# Patient Record
Sex: Female | Born: 1980 | Race: Asian | Hispanic: No | Marital: Single | Smoking: Never smoker
Health system: Southern US, Community
[De-identification: ages and names within clinical notes are randomized; demographics above are authoritative.]

## PROBLEM LIST (undated history)

## (undated) DIAGNOSIS — Z789 Other specified health status: Secondary | ICD-10-CM

## (undated) HISTORY — PX: NO PAST SURGERIES: SHX2092

---
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
## 2003-06-27 ENCOUNTER — Encounter: Admission: RE | Admit: 2003-06-27 | Discharge: 2003-06-27 | Payer: Self-pay | Admitting: *Deleted

## 2004-03-26 ENCOUNTER — Other Ambulatory Visit: Admission: RE | Admit: 2004-03-26 | Discharge: 2004-03-26 | Payer: Self-pay | Admitting: Obstetrics and Gynecology

## 2004-03-27 ENCOUNTER — Other Ambulatory Visit: Admission: RE | Admit: 2004-03-27 | Discharge: 2004-03-27 | Payer: Self-pay | Admitting: Obstetrics and Gynecology

## 2004-04-03 ENCOUNTER — Ambulatory Visit (HOSPITAL_COMMUNITY): Admission: RE | Admit: 2004-04-03 | Discharge: 2004-04-03 | Payer: Self-pay | Admitting: Obstetrics and Gynecology

## 2004-10-08 ENCOUNTER — Inpatient Hospital Stay (HOSPITAL_COMMUNITY): Admission: AD | Admit: 2004-10-08 | Discharge: 2004-10-10 | Payer: Self-pay | Admitting: Obstetrics and Gynecology

## 2005-04-02 ENCOUNTER — Other Ambulatory Visit: Admission: RE | Admit: 2005-04-02 | Discharge: 2005-04-02 | Payer: Self-pay | Admitting: Obstetrics and Gynecology

## 2005-11-05 ENCOUNTER — Ambulatory Visit: Payer: Self-pay | Admitting: Nurse Practitioner

## 2005-11-11 ENCOUNTER — Ambulatory Visit: Payer: Self-pay | Admitting: Nurse Practitioner

## 2005-11-11 ENCOUNTER — Ambulatory Visit (HOSPITAL_COMMUNITY): Admission: RE | Admit: 2005-11-11 | Discharge: 2005-11-11 | Payer: Self-pay | Admitting: Nurse Practitioner

## 2005-11-13 ENCOUNTER — Ambulatory Visit (HOSPITAL_COMMUNITY): Admission: RE | Admit: 2005-11-13 | Discharge: 2005-11-13 | Payer: Self-pay | Admitting: Internal Medicine

## 2005-11-19 ENCOUNTER — Ambulatory Visit: Payer: Self-pay | Admitting: Nurse Practitioner

## 2005-11-27 ENCOUNTER — Ambulatory Visit: Payer: Self-pay | Admitting: *Deleted

## 2005-11-30 ENCOUNTER — Ambulatory Visit (HOSPITAL_COMMUNITY): Admission: RE | Admit: 2005-11-30 | Discharge: 2005-11-30 | Payer: Self-pay | Admitting: Nurse Practitioner

## 2005-12-01 ENCOUNTER — Ambulatory Visit: Payer: Self-pay | Admitting: Nurse Practitioner

## 2006-08-18 ENCOUNTER — Emergency Department (HOSPITAL_COMMUNITY): Admission: EM | Admit: 2006-08-18 | Discharge: 2006-08-18 | Payer: Self-pay | Admitting: Emergency Medicine

## 2006-09-15 ENCOUNTER — Ambulatory Visit: Payer: Self-pay | Admitting: Nurse Practitioner

## 2006-09-21 ENCOUNTER — Ambulatory Visit: Payer: Self-pay | Admitting: Nurse Practitioner

## 2006-09-28 ENCOUNTER — Ambulatory Visit (HOSPITAL_COMMUNITY): Admission: RE | Admit: 2006-09-28 | Discharge: 2006-09-28 | Payer: Self-pay | Admitting: Family Medicine

## 2006-09-30 ENCOUNTER — Ambulatory Visit: Payer: Self-pay | Admitting: Nurse Practitioner

## 2007-02-06 ENCOUNTER — Ambulatory Visit (HOSPITAL_COMMUNITY): Admission: RE | Admit: 2007-02-06 | Discharge: 2007-02-06 | Payer: Self-pay | Admitting: Endocrinology

## 2007-02-15 ENCOUNTER — Other Ambulatory Visit: Admission: RE | Admit: 2007-02-15 | Discharge: 2007-02-15 | Payer: Self-pay | Admitting: Internal Medicine

## 2009-12-15 ENCOUNTER — Ambulatory Visit: Payer: Self-pay | Admitting: Advanced Practice Midwife

## 2009-12-15 ENCOUNTER — Inpatient Hospital Stay (HOSPITAL_COMMUNITY): Admission: AD | Admit: 2009-12-15 | Discharge: 2009-12-15 | Payer: Self-pay | Admitting: Obstetrics and Gynecology

## 2009-12-17 ENCOUNTER — Inpatient Hospital Stay (HOSPITAL_COMMUNITY): Admission: AD | Admit: 2009-12-17 | Discharge: 2009-12-17 | Payer: Self-pay | Admitting: Obstetrics & Gynecology

## 2010-12-29 NOTE — L&D Delivery Note (Signed)
Pt rapidly completed the first stage.  Second stage was brief.  She had a SVD of one live viable female over second degree midline tear.  Placenta S/I 3 vessel cord.  Tear closed with 3-0 vicryl.  EBL-400cc.  Baby to NBN.

## 2011-01-09 LAB — HIV ANTIBODY (ROUTINE TESTING W REFLEX): HIV: NONREACTIVE

## 2011-01-09 LAB — ABO/RH: RH Type: POSITIVE

## 2011-01-18 ENCOUNTER — Encounter: Payer: Self-pay | Admitting: Family Medicine

## 2011-01-19 ENCOUNTER — Encounter: Payer: Self-pay | Admitting: Endocrinology

## 2011-03-31 LAB — WET PREP, GENITAL
Clue Cells Wet Prep HPF POC: NONE SEEN
Trich, Wet Prep: NONE SEEN
Yeast Wet Prep HPF POC: NONE SEEN

## 2011-03-31 LAB — URINALYSIS, ROUTINE W REFLEX MICROSCOPIC
Bilirubin Urine: NEGATIVE
Glucose, UA: NEGATIVE mg/dL
Ketones, ur: NEGATIVE mg/dL
Nitrite: NEGATIVE
Protein, ur: NEGATIVE mg/dL
Specific Gravity, Urine: <1.005 — ABNORMAL LOW (ref 1.005–1.030)
Urobilinogen, UA: 0.2 mg/dL (ref 0.0–1.0)
pH: 5.5 (ref 5.0–8.0)

## 2011-03-31 LAB — GC/CHLAMYDIA PROBE AMP, GENITAL
Chlamydia, DNA Probe: NEGATIVE
GC Probe Amp, Genital: NEGATIVE

## 2011-03-31 LAB — ABO/RH: ABO/RH(D): A POS

## 2011-03-31 LAB — CBC
HCT: 36.9 % (ref 36.0–46.0)
Hemoglobin: 12 g/dL (ref 12.0–15.0)
MCHC: 32.6 g/dL (ref 30.0–36.0)
MCV: 81.5 fL (ref 78.0–100.0)
Platelets: 310 10*3/uL (ref 150–400)
RBC: 4.53 MIL/uL (ref 3.87–5.11)
RDW: 14.7 % (ref 11.5–15.5)
WBC: 8.1 10*3/uL (ref 4.0–10.5)

## 2011-03-31 LAB — HCG, QUANTITATIVE, PREGNANCY
hCG, Beta Chain, Quant, S: 1325 m[IU]/mL — ABNORMAL HIGH (ref <5)
hCG, Beta Chain, Quant, S: 254 m[IU]/mL — ABNORMAL HIGH (ref <5)

## 2011-03-31 LAB — URINE MICROSCOPIC-ADD ON

## 2011-08-05 ENCOUNTER — Encounter (HOSPITAL_COMMUNITY): Payer: Self-pay | Admitting: Anesthesiology

## 2011-08-05 ENCOUNTER — Encounter (HOSPITAL_COMMUNITY): Payer: Self-pay | Admitting: *Deleted

## 2011-08-05 ENCOUNTER — Inpatient Hospital Stay (HOSPITAL_COMMUNITY): Payer: Medicaid Other | Admitting: Anesthesiology

## 2011-08-05 ENCOUNTER — Inpatient Hospital Stay (HOSPITAL_COMMUNITY)
Admission: AD | Admit: 2011-08-05 | Discharge: 2011-08-07 | DRG: 775 | Disposition: A | Discharge status: Home / Self Care | Payer: Medicaid Other | Source: Ambulatory Visit | Attending: Obstetrics and Gynecology | Admitting: Obstetrics and Gynecology

## 2011-08-05 DIAGNOSIS — Z3480 Encounter for supervision of other normal pregnancy, unspecified trimester: Secondary | ICD-10-CM

## 2011-08-05 HISTORY — DX: Other specified health status: Z78.9

## 2011-08-05 LAB — CBC
HCT: 41.2 % (ref 36.0–46.0)
Hemoglobin: 13.4 g/dL (ref 12.0–15.0)
MCHC: 32.5 g/dL (ref 30.0–36.0)

## 2011-08-05 MED ORDER — EPHEDRINE 5 MG/ML INJ
10.0000 mg | INTRAVENOUS | Status: DC | PRN
  Filled 2011-08-05: qty 4

## 2011-08-05 MED ORDER — LIDOCAINE HCL (PF) 1 % IJ SOLN
30.0000 mL | INTRAMUSCULAR | Status: DC | PRN
  Filled 2011-08-05 (×2): qty 30

## 2011-08-05 MED ORDER — ONDANSETRON HCL 4 MG/2ML IJ SOLN: 4.0000 mg | INTRAMUSCULAR | Status: DC | PRN

## 2011-08-05 MED ORDER — IBUPROFEN 600 MG PO TABS
600.0000 mg | ORAL_TABLET | Freq: Four times a day (QID) | ORAL | Status: DC
  Administered 2011-08-06 – 2011-08-07 (×7): 600 mg via ORAL
  Filled 2011-08-05 (×8): qty 1

## 2011-08-05 MED ORDER — CITRIC ACID-SODIUM CITRATE 334-500 MG/5ML PO SOLN: 30.0000 mL | ORAL | Status: DC | PRN

## 2011-08-05 MED ORDER — SIMETHICONE 80 MG PO CHEW: 80.0000 mg | CHEWABLE_TABLET | ORAL | Status: DC | PRN

## 2011-08-05 MED ORDER — LACTATED RINGERS IV SOLN
INTRAVENOUS | Status: DC
  Administered 2011-08-05 (×2): via INTRAVENOUS

## 2011-08-05 MED ORDER — LIDOCAINE HCL 1.5 % IJ SOLN
INTRAMUSCULAR | Status: DC | PRN
  Administered 2011-08-05 (×2): 4 mL via EPIDURAL

## 2011-08-05 MED ORDER — OXYTOCIN 10 UNIT/ML IJ SOLN
INTRAMUSCULAR | Status: AC
  Filled 2011-08-05: qty 2

## 2011-08-05 MED ORDER — OXYCODONE-ACETAMINOPHEN 5-325 MG PO TABS: 2.0000 | ORAL_TABLET | ORAL | Status: DC | PRN

## 2011-08-05 MED ORDER — OXYTOCIN 20 UNITS IN LACTATED RINGERS INFUSION - SIMPLE
125.0000 mL/h | Freq: Once | INTRAVENOUS | Status: AC
  Administered 2011-08-05: 999 mL/h via INTRAVENOUS
  Filled 2011-08-05: qty 1000

## 2011-08-05 MED ORDER — IBUPROFEN 600 MG PO TABS: 600.0000 mg | ORAL_TABLET | Freq: Four times a day (QID) | ORAL | Status: DC | PRN

## 2011-08-05 MED ORDER — LACTATED RINGERS IV SOLN: 500.0000 mL | INTRAVENOUS | Status: DC | PRN

## 2011-08-05 MED ORDER — EPHEDRINE 5 MG/ML INJ
10.0000 mg | INTRAVENOUS | Status: DC | PRN
  Filled 2011-08-05 (×2): qty 4

## 2011-08-05 MED ORDER — ONDANSETRON HCL 4 MG/2ML IJ SOLN: 4.0000 mg | Freq: Four times a day (QID) | INTRAMUSCULAR | Status: DC | PRN

## 2011-08-05 MED ORDER — NALBUPHINE HCL 10 MG/ML IJ SOLN: 10.0000 mg | Freq: Once | INTRAMUSCULAR | Status: DC

## 2011-08-05 MED ORDER — PHENYLEPHRINE 40 MCG/ML (10ML) SYRINGE FOR IV PUSH (FOR BLOOD PRESSURE SUPPORT)
80.0000 ug | PREFILLED_SYRINGE | INTRAVENOUS | Status: DC | PRN
  Filled 2011-08-05 (×2): qty 5

## 2011-08-05 MED ORDER — ONDANSETRON HCL 4 MG PO TABS: 4.0000 mg | ORAL_TABLET | ORAL | Status: DC | PRN

## 2011-08-05 MED ORDER — FLEET ENEMA 7-19 GM/118ML RE ENEM: 1.0000 | ENEMA | RECTAL | Status: DC | PRN

## 2011-08-05 MED ORDER — FENTANYL 2.5 MCG/ML BUPIVACAINE 1/10 % EPIDURAL INFUSION (WH - ANES)
14.0000 mL/h | INTRAMUSCULAR | Status: DC
  Administered 2011-08-05 (×2): 14 mL/h via EPIDURAL
  Filled 2011-08-05 (×2): qty 60

## 2011-08-05 MED ORDER — DIBUCAINE 1 % RE OINT
1.0000 "application " | TOPICAL_OINTMENT | RECTAL | Status: DC | PRN
  Filled 2011-08-05: qty 28

## 2011-08-05 MED ORDER — DIPHENHYDRAMINE HCL 50 MG/ML IJ SOLN: 12.5000 mg | INTRAMUSCULAR | Status: DC | PRN

## 2011-08-05 MED ORDER — MEASLES, MUMPS & RUBELLA VAC ~~LOC~~ INJ: 0.5000 mL | INJECTION | Freq: Once | SUBCUTANEOUS | Status: DC

## 2011-08-05 MED ORDER — PENICILLIN G POTASSIUM 5000000 UNITS IJ SOLR
2.5000 10*6.[IU] | INTRAVENOUS | Status: DC
  Administered 2011-08-05: 2.5 10*6.[IU] via INTRAVENOUS
  Filled 2011-08-05 (×3): qty 2.5

## 2011-08-05 MED ORDER — WITCH HAZEL-GLYCERIN EX PADS: 1.0000 "application " | MEDICATED_PAD | CUTANEOUS | Status: DC | PRN

## 2011-08-05 MED ORDER — OXYCODONE-ACETAMINOPHEN 5-325 MG PO TABS
1.0000 | ORAL_TABLET | ORAL | Status: DC | PRN
  Administered 2011-08-05: 1 via ORAL
  Filled 2011-08-05 (×2): qty 1

## 2011-08-05 MED ORDER — BENZOCAINE-MENTHOL 20-0.5 % EX AERO: 1.0000 "application " | INHALATION_SPRAY | CUTANEOUS | Status: DC | PRN

## 2011-08-05 MED ORDER — ZOLPIDEM TARTRATE 5 MG PO TABS: 5.0000 mg | ORAL_TABLET | Freq: Every evening | ORAL | Status: DC | PRN

## 2011-08-05 MED ORDER — TETANUS-DIPHTH-ACELL PERTUSSIS 5-2.5-18.5 LF-MCG/0.5 IM SUSP
0.5000 mL | Freq: Once | INTRAMUSCULAR | Status: AC
  Administered 2011-08-06: 0.5 mL via INTRAMUSCULAR
  Filled 2011-08-05: qty 0.5

## 2011-08-05 MED ORDER — LACTATED RINGERS IV SOLN: 500.0000 mL | Freq: Once | INTRAVENOUS | Status: DC

## 2011-08-05 MED ORDER — PENICILLIN G POTASSIUM 5000000 UNITS IJ SOLR
5.0000 10*6.[IU] | Freq: Once | INTRAVENOUS | Status: AC
  Administered 2011-08-05: 5 10*6.[IU] via INTRAVENOUS
  Filled 2011-08-05: qty 5

## 2011-08-05 MED ORDER — ACETAMINOPHEN 325 MG PO TABS: 650.0000 mg | ORAL_TABLET | ORAL | Status: DC | PRN

## 2011-08-05 MED ORDER — PHENYLEPHRINE 40 MCG/ML (10ML) SYRINGE FOR IV PUSH (FOR BLOOD PRESSURE SUPPORT)
80.0000 ug | PREFILLED_SYRINGE | INTRAVENOUS | Status: DC | PRN
  Filled 2011-08-05: qty 5

## 2011-08-05 NOTE — Progress Notes (Signed)
Pt G4 P2 at 39.1wks, having contractions.  Pt denies problems with pregnancy.  GBS +.

## 2011-08-05 NOTE — Anesthesia Procedure Notes (Signed)
Epidural Patient location during procedure: OB Start time: 08/05/2011 5:41 AM  Staffing Anesthesiologist: Julyssa Kyer A. Performed by: anesthesiologist   Preanesthetic Checklist Completed: patient identified, site marked, surgical consent, pre-op evaluation, timeout performed, IV checked, risks and benefits discussed and monitors and equipment checked  Epidural Patient position: sitting Prep: site prepped and draped and DuraPrep Patient monitoring: continuous pulse ox and blood pressure Approach: midline Injection technique: LOR air  Needle:  Needle type: Tuohy  Needle gauge: 17 G Needle length: 9 cm Needle insertion depth: 5 cm cm Catheter type: closed end flexible Catheter size: 19 Gauge Catheter at skin depth: 8 cm Test dose: negative  Assessment Events: blood not aspirated, injection not painful, no injection resistance, negative IV test and no paresthesia  Additional Notes Patient is more comfortable after epidural dosed. Please see RN's note for documentation of vital signs and FHR which are stable.

## 2011-08-05 NOTE — Progress Notes (Signed)
UR Chart review completed.  

## 2011-08-05 NOTE — Anesthesia Preprocedure Evaluation (Signed)
Anesthesia Evaluation  Name, MR# and DOB Patient awake  General Assessment Comment  Reviewed: Allergy & Precautions, H&P , Patient's Chart, lab work & pertinent test results  Airway Mallampati: II TM Distance: >3 FB Neck ROM: full    Dental No notable dental hx. (+) Teeth Intact   Pulmonary  clear to auscultation  pulmonary exam normalPulmonary Exam Normal breath sounds clear to auscultation none    Cardiovascular regular Normal    Neuro/Psych Negative Neurological ROS  Negative Psych ROS  GI/Hepatic/Renal negative GI ROS, negative Liver ROS, and negative Renal ROS (+)       Endo/Other  Negative Endocrine ROS (+)      Abdominal   Musculoskeletal   Hematology negative hematology ROS (+)   Peds  Reproductive/Obstetrics (+) Pregnancy    Anesthesia Other Findings             Anesthesia Physical Anesthesia Plan  ASA: II  Anesthesia Plan: Epidural   Post-op Pain Management:    Induction:   Airway Management Planned:   Additional Equipment:   Intra-op Plan:   Post-operative Plan:   Informed Consent: I have reviewed the patients History and Physical, chart, labs and discussed the procedure including the risks, benefits and alternatives for the proposed anesthesia with the patient or authorized representative who has indicated his/her understanding and acceptance.     Plan Discussed with: Anesthesiologist  Anesthesia Plan Comments:         Anesthesia Quick Evaluation

## 2011-08-06 LAB — CBC
HCT: 36 % (ref 36.0–46.0)
MCHC: 32.8 g/dL (ref 30.0–36.0)
Platelets: 177 10*3/uL (ref 150–400)
RDW: 14.8 % (ref 11.5–15.5)
WBC: 10.4 10*3/uL (ref 4.0–10.5)

## 2011-08-06 MED ORDER — SENNOSIDES-DOCUSATE SODIUM 8.6-50 MG PO TABS
1.0000 | ORAL_TABLET | Freq: Every day | ORAL | Status: DC
  Administered 2011-08-06: 1 via ORAL

## 2011-08-06 NOTE — Progress Notes (Signed)
  Post Partum Day 1 Subjective: no complaints  Objective: Blood pressure 96/61, pulse 67, temperature 98.2 F (36.8 C), temperature source Oral, resp. rate 18, height 5\' 3"  (1.6 m), weight 65.318 kg (144 lb), SpO2 98.00%, not breastfeeding.  Physical Exam:  General: alert Lochia: appropriate Uterine Fundus: firm   Basename 08/06/11 0530 08/05/11 0500  HGB 11.8* 13.4  HCT 36.0 41.2    Assessment/Plan: Plan for discharge tomorrow   LOS: 1 day   Jaxson Anglin D 08/06/2011, 9:44 AM

## 2011-08-06 NOTE — Anesthesia Postprocedure Evaluation (Signed)
  Anesthesia Post-op Note  Patient: Jasmine Henry  Procedure(s) Performed: * Lumbar Epidural for L & D *  Patient Location: Labor and Delivery  Anesthesia Type: Epidural  Level of Consciousness: awake, alert  and oriented  Airway and Oxygen Therapy: Patient Spontanous Breathing  Post-op Pain: none  Post-op Assessment: Post-op Vital signs reviewed, Patient's Cardiovascular Status Stable, Respiratory Function Stable, Patent Airway, No signs of Nausea or vomiting, Pain level controlled, No headache, No backache, No residual numbness and No residual motor weakness  Post-op Vital Signs: Reviewed and stable  Complications: No apparent anesthesia complications

## 2011-08-06 NOTE — Anesthesia Postprocedure Evaluation (Signed)
  Anesthesia Post-op Note  Patient: Jasmine Henry Anesthesia Post Note  Patient: Jasmine Henry  Procedure(s) Performed: * No procedures listed *  Anesthesia type: Epidural  Patient location: Mother/Baby  Post pain: Pain level controlled  Post assessment: Post-op Vital signs reviewed  Last Vitals:  Filed Vitals:   08/05/11 2130  BP: 109/67  Pulse: 85  Temp: 98.7 F (37.1 C)  Resp: 18    Post vital signs: Reviewed  Level of consciousness: awake  Complications: No apparent anesthesia complications

## 2011-08-07 MED ORDER — IBUPROFEN 600 MG PO TABS: 600.0000 mg | ORAL_TABLET | Freq: Four times a day (QID) | ORAL | Status: AC

## 2011-08-07 NOTE — Progress Notes (Signed)
  PPD#2  Pt without complaints.  Wants to go home. Lochia-wnl VSSAF IMP/stable  PLAN/ D/C

## 2011-08-07 NOTE — Discharge Summary (Signed)
Obstetric Discharge Summary Reason for Admission: onset of labor Prenatal Procedures: ultrasound Intrapartum Procedures: spontaneous vaginal delivery Postpartum Procedures: none Complications-Operative and Postpartum: 2 degree perineal laceration Hemoglobin  Date Value Range Status  08/06/2011 11.8* 12.0-15.0 (g/dL) Final     HCT  Date Value Range Status  08/06/2011 36.0  36.0-46.0 (%) Final    Discharge Diagnoses: Term Pregnancy-delivered  Discharge Information: Date: 08/07/2011 Activity: unrestricted Diet: routine Medications: PNV and Ibuprophen Condition: stable Instructions: refer to practice specific booklet Discharge to: home Follow-up Information    Follow up with ANDERSON,MARK E. Make an appointment in 4 weeks.   Contact information:   [address] [place] [address] [phone]          Newborn Data: Live born female  Birth Weight: 6 lb 15 oz (3147 g) APGAR: 8, 9  Home with mother.  ANDERSON,MARK E 08/07/2011, 8:20 AM

## 2011-08-07 NOTE — H&P (Signed)
Pt is a 30 year old female who was admitted by Dr. Tenny Craw in labor.  PNC was uncomplicated. PMHx: see [facility]. PE: HEENT- wnl  Abd-gravid, palp ctxs  FHTs - reactive IMP/IUP at term PLAN/ Admit

## 2011-12-30 NOTE — L&D Delivery Note (Signed)
Patient was C/C/+1 and pushed for <15 minutes with epidural.   NSVD  female infant, Apgars 9/9, weight pending.   The patient had one small second degree perineal laceration repaired with 3-0 vicryl. Fundus was firm. EBL was expected. Placenta was delivered intact. Vagina was clear.  Baby was vigorous to bedside.  Philip Aspen

## 2012-05-17 LAB — OB RESULTS CONSOLE GC/CHLAMYDIA
Chlamydia: NEGATIVE
Gonorrhea: NEGATIVE

## 2012-07-26 ENCOUNTER — Inpatient Hospital Stay (HOSPITAL_COMMUNITY): Payer: Medicaid Other | Admitting: Anesthesiology

## 2012-07-26 ENCOUNTER — Inpatient Hospital Stay (HOSPITAL_COMMUNITY)
Admission: AD | Admit: 2012-07-26 | Discharge: 2012-07-28 | DRG: 775 | Disposition: A | Discharge status: Home / Self Care | Payer: Medicaid Other | Source: Ambulatory Visit | Attending: Obstetrics and Gynecology | Admitting: Obstetrics and Gynecology

## 2012-07-26 ENCOUNTER — Encounter (HOSPITAL_COMMUNITY): Payer: Self-pay | Admitting: *Deleted

## 2012-07-26 ENCOUNTER — Encounter (HOSPITAL_COMMUNITY): Payer: Self-pay | Admitting: Anesthesiology

## 2012-07-26 DIAGNOSIS — O99892 Other specified diseases and conditions complicating childbirth: Secondary | ICD-10-CM | POA: Diagnosis present

## 2012-07-26 DIAGNOSIS — Z22330 Carrier of Group B streptococcus: Secondary | ICD-10-CM

## 2012-07-26 LAB — CBC
Platelets: 198 10*3/uL (ref 150–400)
RBC: 4.58 MIL/uL (ref 3.87–5.11)
RDW: 14.7 % (ref 11.5–15.5)
WBC: 8.5 10*3/uL (ref 4.0–10.5)

## 2012-07-26 MED ORDER — PHENYLEPHRINE 40 MCG/ML (10ML) SYRINGE FOR IV PUSH (FOR BLOOD PRESSURE SUPPORT)
80.0000 ug | PREFILLED_SYRINGE | INTRAVENOUS | Status: DC | PRN
  Filled 2012-07-26: qty 5

## 2012-07-26 MED ORDER — LACTATED RINGERS IV SOLN: 500.0000 mL | INTRAVENOUS | Status: DC | PRN

## 2012-07-26 MED ORDER — LACTATED RINGERS IV SOLN
500.0000 mL | Freq: Once | INTRAVENOUS | Status: AC
  Administered 2012-07-26: 500 mL via INTRAVENOUS

## 2012-07-26 MED ORDER — ONDANSETRON HCL 4 MG/2ML IJ SOLN: 4.0000 mg | Freq: Four times a day (QID) | INTRAMUSCULAR | Status: DC | PRN

## 2012-07-26 MED ORDER — OXYTOCIN BOLUS FROM INFUSION
250.0000 mL | Freq: Once | INTRAVENOUS | Status: DC
  Filled 2012-07-26: qty 500

## 2012-07-26 MED ORDER — FENTANYL 2.5 MCG/ML BUPIVACAINE 1/10 % EPIDURAL INFUSION (WH - ANES)
14.0000 mL/h | INTRAMUSCULAR | Status: DC
  Administered 2012-07-27: 14 mL/h via EPIDURAL
  Filled 2012-07-26 (×2): qty 60

## 2012-07-26 MED ORDER — CITRIC ACID-SODIUM CITRATE 334-500 MG/5ML PO SOLN: 30.0000 mL | ORAL | Status: DC | PRN

## 2012-07-26 MED ORDER — ACETAMINOPHEN 325 MG PO TABS: 650.0000 mg | ORAL_TABLET | ORAL | Status: DC | PRN

## 2012-07-26 MED ORDER — FENTANYL 2.5 MCG/ML BUPIVACAINE 1/10 % EPIDURAL INFUSION (WH - ANES)
INTRAMUSCULAR | Status: DC | PRN
  Administered 2012-07-26: 13 mL/h via EPIDURAL

## 2012-07-26 MED ORDER — LIDOCAINE HCL (PF) 1 % IJ SOLN
30.0000 mL | INTRAMUSCULAR | Status: DC | PRN
  Administered 2012-07-27: 30 mL via SUBCUTANEOUS
  Filled 2012-07-26: qty 30

## 2012-07-26 MED ORDER — EPHEDRINE 5 MG/ML INJ: 10.0000 mg | INTRAVENOUS | Status: DC | PRN

## 2012-07-26 MED ORDER — LACTATED RINGERS IV SOLN
INTRAVENOUS | Status: DC
  Administered 2012-07-26 – 2012-07-27 (×2): via INTRAVENOUS

## 2012-07-26 MED ORDER — PHENYLEPHRINE 40 MCG/ML (10ML) SYRINGE FOR IV PUSH (FOR BLOOD PRESSURE SUPPORT): 80.0000 ug | PREFILLED_SYRINGE | INTRAVENOUS | Status: DC | PRN

## 2012-07-26 MED ORDER — FLEET ENEMA 7-19 GM/118ML RE ENEM: 1.0000 | ENEMA | RECTAL | Status: DC | PRN

## 2012-07-26 MED ORDER — PENICILLIN G POTASSIUM 5000000 UNITS IJ SOLR
5.0000 10*6.[IU] | Freq: Once | INTRAVENOUS | Status: AC
  Administered 2012-07-26: 5 10*6.[IU] via INTRAVENOUS
  Filled 2012-07-26: qty 5

## 2012-07-26 MED ORDER — OXYCODONE-ACETAMINOPHEN 5-325 MG PO TABS: 1.0000 | ORAL_TABLET | ORAL | Status: DC | PRN

## 2012-07-26 MED ORDER — SODIUM BICARBONATE 8.4 % IV SOLN
INTRAVENOUS | Status: DC | PRN
  Administered 2012-07-26: 4 mL via EPIDURAL

## 2012-07-26 MED ORDER — PENICILLIN G POTASSIUM 5000000 UNITS IJ SOLR
2.5000 10*6.[IU] | INTRAVENOUS | Status: DC
  Administered 2012-07-27: 2.5 10*6.[IU] via INTRAVENOUS
  Filled 2012-07-26 (×4): qty 2.5

## 2012-07-26 MED ORDER — EPHEDRINE 5 MG/ML INJ
10.0000 mg | INTRAVENOUS | Status: DC | PRN
  Filled 2012-07-26: qty 4

## 2012-07-26 MED ORDER — IBUPROFEN 600 MG PO TABS: 600.0000 mg | ORAL_TABLET | Freq: Four times a day (QID) | ORAL | Status: DC | PRN

## 2012-07-26 MED ORDER — OXYTOCIN 40 UNITS IN LACTATED RINGERS INFUSION - SIMPLE MED
62.5000 mL/h | Freq: Once | INTRAVENOUS | Status: AC
  Administered 2012-07-27: 62.5 mL/h via INTRAVENOUS
  Filled 2012-07-26: qty 1000

## 2012-07-26 MED ORDER — DIPHENHYDRAMINE HCL 50 MG/ML IJ SOLN: 12.5000 mg | INTRAMUSCULAR | Status: DC | PRN

## 2012-07-26 NOTE — Anesthesia Procedure Notes (Signed)
Epidural Patient location during procedure: OB  Preanesthetic Checklist Completed: patient identified, site marked, surgical consent, pre-op evaluation, timeout performed, IV checked, risks and benefits discussed and monitors and equipment checked  Epidural Patient position: sitting Prep: site prepped and draped and DuraPrep Patient monitoring: continuous pulse ox and blood pressure Approach: midline Injection technique: LOR air  Needle:  Needle type: Tuohy  Needle gauge: 17 G Needle length: 9 cm Needle insertion depth: 4 cm Catheter type: closed end flexible Catheter size: 19 Gauge Catheter at skin depth: 9 cm Test dose: negative  Assessment Events: blood not aspirated, injection not painful, no injection resistance, negative IV test and no paresthesia  Additional Notes Dosing of Epidural:  1st dose, through needle ............................................. epi 1:200K + Xylocaine 40 mg  2nd dose, through catheter, after waiting 3 minutes.....epi 1:200K + Xylocaine 40 mg  3rd dose, through catheter after waiting 3 minutes .............................Marcaine   4mg   ( mg Marcaine are expressed as equivilent  cc's medication removed from the 0.1%Bupiv / fentanyl syringe from L&D pump)  ( 2% Xylo charted as a single dose in Epic Meds for ease of charting; actual dosing was fractionated as above, for saftey's sake)  As each dose occurred, patient was free of IV sx; and patient exhibited no evidence of SA injection.  Patient is more comfortable after epidural dosed. Please see RN's note for documentation of vital signs,and FHR which are stable.  Patient reminded not to try to ambulate with numb legs, and that an RN must be present the 1st time she attempts to get up.    

## 2012-07-26 NOTE — Anesthesia Preprocedure Evaluation (Signed)

## 2012-07-26 NOTE — MAU Note (Signed)
PT SAYS  SHE WAS 1 CM  LAST WEEEK.  DENIES HSV AND MRSA.  HURT X2 HRS.

## 2012-07-26 NOTE — H&P (Signed)
31 y.o. [redacted]w[redacted]d  G4P1003 comes in c/o ctx.  Otherwise has good fetal movement, some bloody show.  No LOF.  Past Medical History  Diagnosis Date  . No pertinent past medical history     Past Surgical History  Procedure Date  . No past surgeries     OB History    Grav Para Term Preterm Abortions TAB SAB Ect Mult Living   4 3 1       3      # Outc Date GA Lbr Len/2nd Wgt Sex Del Anes PTL Lv   1 TRM 8/12 [redacted]w[redacted]d 16:46 / 00:38 6lb15oz(3.147kg) F SVD EPI  Yes   Comments: 4-5 mm dark brown birthmark on inside of L thigh   2 PAR         Yes   3 PAR         Yes   4 CUR               History   Social History  . Marital Status: Single    Spouse Name: N/A    Number of Children: N/A  . Years of Education: N/A   Occupational History  . Not on file.   Social History Main Topics  . Smoking status: Never Smoker   . Smokeless tobacco: Not on file  . Alcohol Use: No  . Drug Use: No  . Sexually Active: Yes    Birth Control/ Protection: Pill   Other Topics Concern  . Not on file   Social History Narrative  . No narrative on file   Review of patient's allergies indicates no known allergies.   Prenatal Course:  uncomplicated  Filed Vitals:   07/26/12 2335  BP: 109/73  Pulse: 93  Temp:   Resp: 20     Lungs/Cor:  NAD Abdomen:  soft, gravid Ex:  no cords, erythema SVE:  3 in MAU, now 5/70/-2 FHTs:  150, good STV, NST R Toco:  q2-5   A/P   Admit in labor  GBS Pos - abx prophylaxis with PCN  Epidural  Routine orders **Pt's sister just died of colon cancer on 06-05-2024[place], [place]

## 2012-07-27 ENCOUNTER — Encounter (HOSPITAL_COMMUNITY): Payer: Self-pay | Admitting: *Deleted

## 2012-07-27 LAB — RPR: RPR Ser Ql: NONREACTIVE

## 2012-07-27 MED ORDER — PRENATAL MULTIVITAMIN CH: 1.0000 | ORAL_TABLET | Freq: Every day | ORAL | Status: DC

## 2012-07-27 MED ORDER — WITCH HAZEL-GLYCERIN EX PADS: 1.0000 "application " | MEDICATED_PAD | CUTANEOUS | Status: DC | PRN

## 2012-07-27 MED ORDER — ONDANSETRON HCL 4 MG PO TABS: 4.0000 mg | ORAL_TABLET | ORAL | Status: DC | PRN

## 2012-07-27 MED ORDER — SENNOSIDES-DOCUSATE SODIUM 8.6-50 MG PO TABS
2.0000 | ORAL_TABLET | Freq: Every day | ORAL | Status: DC
  Administered 2012-07-27: 2 via ORAL

## 2012-07-27 MED ORDER — LACTATED RINGERS IV SOLN: INTRAVENOUS | Status: DC

## 2012-07-27 MED ORDER — DIPHENHYDRAMINE HCL 25 MG PO CAPS: 25.0000 mg | ORAL_CAPSULE | Freq: Four times a day (QID) | ORAL | Status: DC | PRN

## 2012-07-27 MED ORDER — LANOLIN HYDROUS EX OINT: TOPICAL_OINTMENT | CUTANEOUS | Status: DC | PRN

## 2012-07-27 MED ORDER — TETANUS-DIPHTH-ACELL PERTUSSIS 5-2.5-18.5 LF-MCG/0.5 IM SUSP
0.5000 mL | Freq: Once | INTRAMUSCULAR | Status: DC
Start: 2012-07-28 — End: 2012-07-28
  Filled 2012-07-27: qty 0.5

## 2012-07-27 MED ORDER — IBUPROFEN 600 MG PO TABS
600.0000 mg | ORAL_TABLET | Freq: Four times a day (QID) | ORAL | Status: DC
  Administered 2012-07-27 – 2012-07-28 (×4): 600 mg via ORAL
  Filled 2012-07-27 (×4): qty 1

## 2012-07-27 MED ORDER — PRENATAL PLUS 27-1 MG PO TABS
1.0000 | ORAL_TABLET | Freq: Every day | ORAL | Status: DC
  Administered 2012-07-27 – 2012-07-28 (×2): 1 via ORAL
  Filled 2012-07-27 (×2): qty 1

## 2012-07-27 MED ORDER — ONDANSETRON HCL 4 MG/2ML IJ SOLN: 4.0000 mg | INTRAMUSCULAR | Status: DC | PRN

## 2012-07-27 MED ORDER — DIBUCAINE 1 % RE OINT: 1.0000 "application " | TOPICAL_OINTMENT | RECTAL | Status: DC | PRN

## 2012-07-27 MED ORDER — OXYCODONE-ACETAMINOPHEN 5-325 MG PO TABS: 1.0000 | ORAL_TABLET | ORAL | Status: DC | PRN

## 2012-07-27 MED ORDER — BENZOCAINE-MENTHOL 20-0.5 % EX AERO
1.0000 "application " | INHALATION_SPRAY | CUTANEOUS | Status: DC | PRN
  Filled 2012-07-27 (×2): qty 56

## 2012-07-27 MED ORDER — SIMETHICONE 80 MG PO CHEW: 80.0000 mg | CHEWABLE_TABLET | ORAL | Status: DC | PRN

## 2012-07-27 MED ORDER — ZOLPIDEM TARTRATE 5 MG PO TABS: 5.0000 mg | ORAL_TABLET | Freq: Every evening | ORAL | Status: DC | PRN

## 2012-07-27 NOTE — Progress Notes (Signed)
UR chart review completed.  

## 2012-07-27 NOTE — Progress Notes (Signed)
Post Partum Day 0.5 Subjective: no complaints  Objective: Blood pressure 97/56, pulse 103, temperature 99.4 F (37.4 C), temperature source Oral, resp. rate 18, height 5\' 2"  (1.575 m), weight 60.782 kg (134 lb), SpO2 98.00%, unknown if currently breastfeeding.  Physical Exam:  General: alert Lochia: appropriate Uterine Fundus: firm   Basename 07/26/12 2241  HGB 11.7*  HCT 36.6    Assessment/Plan: Observation   LOS: 1 day   Kyerra Vargo D 07/27/2012, 9:15 AM

## 2012-07-27 NOTE — Anesthesia Postprocedure Evaluation (Signed)
  Anesthesia Post Note  Patient: Jasmine Henry  Procedure(s) Performed: * No procedures listed *  Anesthesia type: Epidural  Patient location: Mother/Baby  Post pain: Pain level controlled  Post assessment: Post-op Vital signs reviewed  Last Vitals:  Filed Vitals:   07/27/12 1040  BP: 103/60  Pulse: 100  Temp: 36.9 C  Resp: 17    Post vital signs: Reviewed  Level of consciousness:alert  Complications: No apparent anesthesia complications

## 2012-07-28 LAB — CBC
Hemoglobin: 11.5 g/dL — ABNORMAL LOW (ref 12.0–15.0)
MCV: 79.6 fL (ref 78.0–100.0)
Platelets: 178 10*3/uL (ref 150–400)
RBC: 4.56 MIL/uL (ref 3.87–5.11)
WBC: 9.1 10*3/uL (ref 4.0–10.5)

## 2012-07-28 MED ORDER — MEASLES, MUMPS & RUBELLA VAC ~~LOC~~ INJ
0.5000 mL | INJECTION | Freq: Once | SUBCUTANEOUS | Status: AC
  Administered 2012-07-28: 0.5 mL via SUBCUTANEOUS
  Filled 2012-07-28: qty 0.5

## 2012-07-28 NOTE — Progress Notes (Signed)
Patient is eating, ambulating, voiding.  Pain control is good.  Filed Vitals:   07/27/12 1040 07/27/12 1930 07/27/12 2135 07/28/12 0508  BP: 103/60 97/54 95/62  80/50  Pulse: 100 87 79 73  Temp: 98.4 F (36.9 C) 98 F (36.7 C) 98.4 F (36.9 C) 97.5 F (36.4 C)  TempSrc: Oral Oral Oral Oral  Resp: 17 16 18 18   Height:      Weight:      SpO2:        Fundus firm Perineum without swelling.  Lab Results  Component Value Date   WBC 9.1 07/28/2012   HGB 11.5* 07/28/2012   HCT 36.3 07/28/2012   MCV 79.6 07/28/2012   PLT 178 07/28/2012     /R equiv/ A+  A/P Post partum day 1.  Routine care.  Expect d/c tomorrow.    Muhamad Serano A

## 2012-07-28 NOTE — Discharge Summary (Signed)
Obstetric Discharge Summary Reason for Admission: onset of labor Prenatal Procedures: none Intrapartum Procedures: spontaneous vaginal delivery Postpartum Procedures: none and Rubella Ig Complications-Operative and Postpartum: 2 degree perineal laceration Hemoglobin  Date Value Range Status  07/28/2012 11.5* 12.0 - 15.0 g/dL Final     HCT  Date Value Range Status  07/28/2012 36.3  36.0 - 46.0 % Final    Discharge Diagnoses: Term Pregnancy-delivered  Discharge Information: Date: 07/28/2012 Activity: pelvic rest Diet: routine Medications: Ibuprofen Condition: stable Instructions: refer to practice specific booklet Discharge to: home Follow-up Information    Follow up with CALLAHAN, SIDNEY, DO. Schedule an appointment as soon as possible for a visit in 4 weeks.   Contact information:   [address] [facility] [place] [address] [phone]          Newborn Data: Live born female  Birth Weight: 7 lb 13 oz (3544 g) APGAR: 9, 9  Home with mother.  Jasmine Henry A 07/28/2012, 9:38 AM

## 2012-07-28 NOTE — Progress Notes (Signed)
Pt is Rubella titer equivocal- needs vaccine.

## 2012-09-07 ENCOUNTER — Ambulatory Visit (INDEPENDENT_AMBULATORY_CARE_PROVIDER_SITE_OTHER): Payer: Medicaid Other | Admitting: Family Medicine

## 2012-09-07 ENCOUNTER — Encounter: Payer: Self-pay | Admitting: Family Medicine

## 2012-09-07 VITALS — BP 103/78 | HR 91 | Temp 98.8°F | Ht 62.25 in | Wt 116.0 lb

## 2012-09-07 DIAGNOSIS — Z85038 Personal history of other malignant neoplasm of large intestine: Secondary | ICD-10-CM

## 2012-09-07 DIAGNOSIS — Z800 Family history of malignant neoplasm of digestive organs: Secondary | ICD-10-CM | POA: Insufficient documentation

## 2012-09-07 DIAGNOSIS — Z0000 Encounter for general adult medical examination without abnormal findings: Secondary | ICD-10-CM

## 2012-09-07 HISTORY — DX: Family history of malignant neoplasm of digestive organs: Z80.0

## 2012-09-07 NOTE — Patient Instructions (Addendum)
I will make you an appointment with a GI specialist to talk about checking for cancer  If you don't hear about an appointment in a week, give [place] a call  Follow-up yearly

## 2012-09-07 NOTE — Assessment & Plan Note (Signed)
Sister died of colon cancer at young age, possible need for earlier screening.  Will send to GI for consultation for appropriate screening recommendations.

## 2012-09-07 NOTE — Assessment & Plan Note (Signed)
Declines flu vaccine.  Likely got tdap at hospital per patient.  Otherwise UTD.  Low risk, will defer fasting labs until next year.

## 2012-09-07 NOTE — Progress Notes (Signed)
  Subjective:    Patient ID: Jasmine Henry, female    DOB: 1981/11/17, 31 y.o.   MRN: [id number]  HPI New patient here to establish care and talk about screening tests  Has OBGYN, got pap last month.  Concerned with colon cancer screening.  Sister died at age 63 from "Metastatic duodenal adenocarcinoma with met to liver and possibly lungs. Her tumor is positive for G12D Kras mutation"  Doing well with the stresses of recent death, new baby.   Has 2-3 days of fever, sore throat, mild cough.  Patient Information Form: Screening and ROS  AUDIT-C Score: 0 Do you feel safe in relationships? yes PHQ-2:negative  Review of Symptoms  General:  Negative for nexplained weight loss, fever Skin: Negative for new or changing mole, sore that won't heal HEENT: Negative for trouble hearing, trouble seeing, ringing in ears, mouth sores, hoarseness, change in voice, dysphagia. CV:  Negative for chest pain, dyspnea, edema, palpitations Resp: Negative for cough, dyspnea, hemoptysis GI: Negative for nausea, vomiting, diarrhea, constipation, abdominal pain, melena, hematochezia. GU: Negative for dysuria, incontinence, urinary hesitance, hematuria, vaginal or penile discharge, polyuria, sexual difficulty, lumps in testicle or breasts MSK: Negative for muscle cramps or aches, joint pain or swelling Neuro: Negative for headaches, weakness, numbness, dizziness, passing out/fainting Psych: Negative for depression, anxiety, memory problems   Positive for cough, fever, headache Review of Systems See HPi    Objective:   Physical Exam GEN: Alert & Oriented, No acute distress, vitals reviewed HEENT: /AT. EOMI, PERRLA, no conjunctival injection or scleral icterus.  Bilateral tympanic membranes intact without erythema or effusion.  .  Nares without edema or rhinorrhea.  Oropharynx is without wild erythema. No anterior or posterior cervical lymphadenopathy. CV:  Regular Rate & Rhythm, no murmur Respiratory:   Normal work of breathing, CTAB Abd:  + BS, soft, no tenderness to palpation Ext: no pre-tibial edema        Assessment & Plan:

## 2012-09-08 ENCOUNTER — Encounter: Payer: Self-pay | Admitting: Gastroenterology

## 2012-10-05 ENCOUNTER — Ambulatory Visit: Payer: Medicaid Other | Admitting: Gastroenterology

## 2012-10-08 ENCOUNTER — Encounter: Payer: Self-pay | Admitting: Gastroenterology

## 2012-10-11 ENCOUNTER — Encounter: Payer: Self-pay | Admitting: Family Medicine

## 2012-11-08 ENCOUNTER — Ambulatory Visit (INDEPENDENT_AMBULATORY_CARE_PROVIDER_SITE_OTHER): Payer: Medicaid Other | Admitting: Gastroenterology

## 2012-11-08 ENCOUNTER — Encounter: Payer: Self-pay | Admitting: Gastroenterology

## 2012-11-08 VITALS — BP 90/50 | HR 84 | Ht 62.25 in | Wt 117.4 lb

## 2012-11-08 DIAGNOSIS — Z800 Family history of malignant neoplasm of digestive organs: Secondary | ICD-10-CM

## 2012-11-08 NOTE — Patient Instructions (Addendum)
We will call you back with information on getting your sister's medical records to review.   cc: Delbert Harness, MD

## 2012-11-08 NOTE — Progress Notes (Addendum)
History of Present Illness: This is a 31 year old female with a family history of intestinal cancer. The patient relates that her sister died several months ago at age 29 from intestinal cancer that was metastatic to the liver. The patient is unsure whether this was from her small intestine or colon. Dr. Selena Batten Brisoe's recent office note relates the following: "Sister died at age 29 from metastatic duodenal adenocarcinoma with met to liver and possibly lungs. Her tumor is positive for G12D Kras mutation". The patient has occasional mild constipation for years and no other digestive complaints. Denies weight loss, abdominal pain, diarrhea, change in stool caliber, melena, hematochezia, nausea, vomiting, dysphagia, reflux symptoms, chest pain.  Review of Systems: Pertinent positive and negative review of systems were noted in the above HPI section. All other review of systems were otherwise negative.  Current Medications, Allergies, Past Medical History, Past Surgical History, Family History and Social History were reviewed in [employer] record.  Physical Exam: General: Well developed , well nourished, no acute distress Head: Normocephalic and atraumatic Eyes:  sclerae anicteric, EOMI Ears: Normal auditory acuity Mouth: No deformity or lesions Neck: Supple, no masses or thyromegaly Lungs: Clear throughout to auscultation Heart: Regular rate and rhythm; no murmurs, rubs or bruits Abdomen: Soft, non tender and non distended. No masses, hepatosplenomegaly or hernias noted. Normal Bowel sounds Musculoskeletal: Symmetrical with no gross deformities  Skin: No lesions on visible extremities Pulses:  Normal pulses noted Extremities: No clubbing, cyanosis, edema or deformities noted Neurological: Alert oriented x 4, grossly nonfocal Cervical Nodes:  No significant cervical adenopathy Inguinal Nodes: No significant inguinal adenopathy Psychological:  Alert and cooperative. Normal  mood and affect  Assessment and Recommendations:  1. Family history of intestinal cancer in a 14 year old sister. We will attempt to gain permission to access her sisters medical records to help determine the location of her primary tumor. If the primary is colon, or we cannot determined that it was not colon cancer, the patient and her siblings should begin colorectal cancer screening with colonoscopy. If we cannot access her sisters medical records we may have to treat this as possible colon cancer and proceed with screening colonoscopy for the patient and recommended screening colonoscopy for her siblings. If her sister had small intestinal, gastric or duodenal cancer there would be no recommended screening for siblings.   Addendum: After obtaining advice from [facility] risk management and receiving verbal permission from the patient's sister we were cleared to enter the medical record of deceased patient Hlom Repsher.  I reviewed her most recent oncology office visits. 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 Her tumor was noted at [facility] involving the ampulla by Dr. Dwana Melena. I discussed this case by phone with Dr. Mancel Bale and there is no familial risk for duodenal cancer in this setting. An endoscopic screening program is not indicated. Patient is considered routine risk for colorectal cancer and is recommended to start screening colonoscopy at age 31.

## 2012-11-27 ENCOUNTER — Ambulatory Visit: Payer: Self-pay | Admitting: Family Medicine

## 2012-11-27 VITALS — BP 105/72 | HR 102 | Temp 98.8°F | Resp 17 | Ht 62.5 in | Wt 117.0 lb

## 2012-11-27 DIAGNOSIS — B029 Zoster without complications: Secondary | ICD-10-CM

## 2012-11-27 MED ORDER — VALACYCLOVIR HCL 1 G PO TABS: 1000.0000 mg | ORAL_TABLET | Freq: Two times a day (BID) | ORAL | Status: DC

## 2012-11-27 MED ORDER — HYDROCODONE-ACETAMINOPHEN 5-500 MG PO TABS: 1.0000 | ORAL_TABLET | Freq: Three times a day (TID) | ORAL | Status: DC | PRN

## 2012-11-27 NOTE — Progress Notes (Signed)
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    Objective:  Vesicular rash R frontal dermatome  Assessment:  Shingles  Plan:  Valtrex 1 gm tid x 7 days vicodin

## 2012-11-27 NOTE — Patient Instructions (Signed)

## 2012-11-29 ENCOUNTER — Ambulatory Visit (INDEPENDENT_AMBULATORY_CARE_PROVIDER_SITE_OTHER): Payer: Medicaid Other | Admitting: Family Medicine

## 2012-11-29 ENCOUNTER — Encounter: Payer: Self-pay | Admitting: Family Medicine

## 2012-11-29 VITALS — BP 118/80 | HR 97 | Ht 62.5 in | Wt 120.6 lb

## 2012-11-29 DIAGNOSIS — B0239 Other herpes zoster eye disease: Secondary | ICD-10-CM

## 2012-11-29 DIAGNOSIS — H547 Unspecified visual loss: Secondary | ICD-10-CM

## 2012-11-29 DIAGNOSIS — B0230 Zoster ocular disease, unspecified: Secondary | ICD-10-CM

## 2012-11-29 DIAGNOSIS — B029 Zoster without complications: Secondary | ICD-10-CM

## 2012-11-29 HISTORY — DX: Zoster ocular disease, unspecified: B02.30

## 2012-11-29 NOTE — Assessment & Plan Note (Signed)
Pain controlled,  Advised taking valacyclovir 1000 mg TID for 7 days instead of prescribed bid dosing for 10 days.  No sign on opthalmic involvement today, but given distribution over eyelid, will refer to optho for further evaluation.

## 2012-11-29 NOTE — Progress Notes (Signed)
  Subjective:    Patient ID: Jasmine Henry, female    DOB: 05/26/1981, 31 y.o.   MRN: [id number]  HPI Here for follow-up of shingles  Started having rash over right face 5 days ago.  2 days ago went to urgent care, was prescribed valacyclovir 1 gm bid x 10 days and vicodin for pain.  Patient reports continued pain, no new lesions. Reports vision blurriness, but not new since rash.     Review of Systems See HPI    Objective:   Physical Exam GEN: NAD Skin: rash (consistent with resolving shingles) over her right eyelid, forehead and scalp.  No nose involvement. No vesicles. Eye: EOMI, PERRLA.  No conjunctivitis or obvious ulceration      Assessment & Plan:

## 2012-11-29 NOTE — Patient Instructions (Addendum)
Take valacyclovir three times a day for 7 days Will refer you to eye doctor to check your vision and eyes with shingles Yearly checkup after September 10th

## 2013-06-14 ENCOUNTER — Ambulatory Visit (INDEPENDENT_AMBULATORY_CARE_PROVIDER_SITE_OTHER): Payer: Medicaid Other | Admitting: Family Medicine

## 2013-06-14 VITALS — BP 119/77 | HR 76 | Temp 98.2°F | Ht 63.0 in | Wt 109.0 lb

## 2013-06-14 DIAGNOSIS — K644 Residual hemorrhoidal skin tags: Secondary | ICD-10-CM

## 2013-06-14 MED ORDER — TUCKS 50 % EX PADS: 1.0000 "application " | MEDICATED_PAD | Freq: Three times a day (TID) | CUTANEOUS | Status: DC

## 2013-06-14 MED ORDER — HYDROCORTISONE 2.5 % EX OINT: TOPICAL_OINTMENT | Freq: Three times a day (TID) | CUTANEOUS | Status: DC

## 2013-06-14 NOTE — Progress Notes (Signed)
Patient ID: Manaal Mandala, female   DOB: [birth year], 32 y.o.   MRN: [id number]  [facility] Tynika Luddy M. Ranbir Chew, MD Phone: [phone]   Subjective: HPI: Patient is a 32 y.o. female presenting to clinic today for same day appointment.  Hemorrhoids Patient presents for evaluation of possible hemorrhoids. Onset of symptoms was gradual and intermittent. (History of hemorrhoids started with delivery of children.) Symptoms have been gradually worsening since that time. Symptoms include: anorectal itching, bleeding which only occurs with bowel movements, pain with sitting, painful defecation and painful perianal swelling. Patient denies family hx of colorectal CA. Treatment to date has been prep H and rectal suppository. Never had surgery on hemorrhoids.  History Reviewed: Non smoker. Health Maintenance: UTD. Saw GI specialist 6 months ago, no colonoscopy and states sister's cancer is not genetic.  ROS: Please see HPI above.  Objective: Office vital signs reviewed. BP 119/77  Pulse 76  Temp(Src) 98.2 F (36.8 C) (Oral)  Ht 5\' 3"  (1.6 m)  Wt 109 lb (49.442 kg)  BMI 19.31 kg/m2  LMP 06/01/2013  Physical Examination:  General: Awake, alert. NAD Abdomen:+BS, soft, nontender, nondistended Extremities: No edema, no rashes GU: No obvious bleeding or fissure. Has 1cm non-thrombosed hemorrhoid at 12:00 position without irritation Neuro: Grossly intact  Assessment: 32 y.o. female with hemorrhoids  Plan: See Problem List and After Visit Summary

## 2013-06-14 NOTE — Patient Instructions (Signed)
Hemorrhoids  Hemorrhoids are puffy (swollen) veins around the rectum or anus. Hemorrhoids can cause pain, itching, bleeding, or irritation.  HOME CARE   Eat foods with fiber, such as whole grains, beans, nuts, fruits, and vegetables. Ask your doctor about taking products with added fiber in them (fibersupplements).   Drink enough fluid to keep your pee (urine) clear or pale yellow.   Exercise often.   Go to the bathroom when you have the urge to poop. Do not wait.   Avoid straining to poop (bowel movement).   Keep the butt area dry and clean. Use wet toilet paper or moist paper towels.   Medicated creams and medicine inserted into the anus (anal suppository) may be used or applied as told.   Only take medicine as told by your doctor.   Take a warm water bath (sitz bath) for 15 20 minutes to ease pain. Do this 3 4 times a day.   Place ice packs on the area if it is tender or puffy. Use the ice packs between the warm water baths.   Put ice in a plastic bag.   Place a towel between your skin and the bag.   Leave the ice on for 15-20 minutes, 3-4 times a day.   Do not use a donut-shaped pillow or sit on the toilet for a long time.  GET HELP RIGHT AWAY IF:    You have more pain that is not controlled by treatment or medicine.   You have bleeding that will not stop.   You have trouble or are unable to poop (bowel movement).   You have pain or puffiness outside the area of the hemorrhoids.  MAKE SURE YOU:    Understand these instructions.   Will watch your condition.   Will get help right away if you are not doing well or get worse.  Document Released: 09/23/2008 Document Revised: 12/01/2012 Document Reviewed: 10/26/2012  [employer] Patient Information 2014 [employer].

## 2013-06-14 NOTE — Assessment & Plan Note (Signed)
Patient has tried OTC medications. Will prescribe hydrocortisone ointment 2.5% to use TID for itching and swelling. Also given Tucks pads to use for symptomatic relief. RTC if pain worsens, she notices more bleeding. Also given signs of thrombosed hemorrhoids that should prompt her return.

## 2013-08-04 ENCOUNTER — Encounter: Payer: Self-pay | Admitting: Family Medicine

## 2013-08-04 ENCOUNTER — Ambulatory Visit (INDEPENDENT_AMBULATORY_CARE_PROVIDER_SITE_OTHER): Payer: Medicaid Other | Admitting: Family Medicine

## 2013-08-04 VITALS — BP 116/88 | HR 89 | Ht 63.0 in | Wt 113.0 lb

## 2013-08-04 DIAGNOSIS — Z3042 Encounter for surveillance of injectable contraceptive: Secondary | ICD-10-CM

## 2013-08-04 DIAGNOSIS — Z3009 Encounter for other general counseling and advice on contraception: Secondary | ICD-10-CM

## 2013-08-04 DIAGNOSIS — Z3049 Encounter for surveillance of other contraceptives: Secondary | ICD-10-CM

## 2013-08-04 MED ORDER — MEDROXYPROGESTERONE ACETATE 150 MG/ML IM SUSP
150.0000 mg | Freq: Once | INTRAMUSCULAR | Status: AC
  Administered 2013-08-04: 150 mg via INTRAMUSCULAR

## 2013-08-04 NOTE — Patient Instructions (Addendum)
You are getting a depoprovera shot today. Please return if you are having any concerning side effects. Return every 3 months for your shots. Follow up regularly with your PCP and continue the discussion about your family history of cancer.  Medroxyprogesterone injection [Contraceptive] What is this medicine? MEDROXYPROGESTERONE (me DROX ee proe JES te rone) contraceptive injections prevent pregnancy. They provide effective birth control for 3 months. Depo-subQ Provera 104 is also used for treating pain related to endometriosis. This medicine may be used for other purposes; ask your health care provider or pharmacist if you have questions. What should I tell my health care provider before I take this medicine? They need to know if you have any of these conditions: -frequently drink alcohol -asthma -blood vessel disease or a history of a blood clot in the lungs or legs -bone disease such as osteoporosis -breast cancer -diabetes -eating disorder (anorexia nervosa or bulimia) -high blood pressure -HIV infection or AIDS -kidney disease -liver disease -mental depression -migraine -seizures (convulsions) -stroke -tobacco smoker -vaginal bleeding -an unusual or allergic reaction to medroxyprogesterone, other hormones, medicines, foods, dyes, or preservatives -pregnant or trying to get pregnant -breast-feeding How should I use this medicine? Depo-Provera Contraceptive injection is given into a muscle. Depo-subQ Provera 104 injection is given under the skin. These injections are given by a health care professional. You must not be pregnant before getting an injection. The injection is usually given during the first 5 days after the start of a menstrual period or 6 weeks after delivery of a baby. Talk to your pediatrician regarding the use of this medicine in children. Special care may be needed. These injections have been used in female children who have started having menstrual  periods. Overdosage: If you think you have taken too much of this medicine contact a poison control center or emergency room at once. NOTE: This medicine is only for you. Do not share this medicine with others. What if I miss a dose? Try not to miss a dose. You must get an injection once every 3 months to maintain birth control. If you cannot keep an appointment, call and reschedule it. If you wait longer than 13 weeks between Depo-Provera contraceptive injections or longer than 14 weeks between Depo-subQ Provera 104 injections, you could get pregnant. Use another method for birth control if you miss your appointment. You may also need a pregnancy test before receiving another injection. What may interact with this medicine? Do not take this medicine with any of the following medications: -bosentan This medicine may also interact with the following medications: -aminoglutethimide -antibiotics or medicines for infections, especially rifampin, rifabutin, rifapentine, and griseofulvin -aprepitant -barbiturate medicines such as phenobarbital or primidone -bexarotene -carbamazepine -medicines for seizures like ethotoin, felbamate, oxcarbazepine, phenytoin, topiramate -modafinil -St. John's wort This list may not describe all possible interactions. Give your health care provider a list of all the medicines, herbs, non-prescription drugs, or dietary supplements you use. Also tell them if you smoke, drink alcohol, or use illegal drugs. Some items may interact with your medicine. What should I watch for while using this medicine? This drug does not protect you against HIV infection (AIDS) or other sexually transmitted diseases. Use of this product may cause you to lose calcium from your bones. Loss of calcium may cause weak bones (osteoporosis). Only use this product for more than 2 years if other forms of birth control are not right for you. The longer you use this product for birth control the more  likely  you will be at risk for weak bones. Ask your health care professional how you can keep strong bones. You may have a change in bleeding pattern or irregular periods. Many females stop having periods while taking this drug. If you have received your injections on time, your chance of being pregnant is very low. If you think you may be pregnant, see your health care professional as soon as possible. Tell your health care professional if you want to get pregnant within the next year. The effect of this medicine may last a long time after you get your last injection. What side effects may I notice from receiving this medicine? Side effects that you should report to your doctor or health care professional as soon as possible: -allergic reactions like skin rash, itching or hives, swelling of the face, lips, or tongue -breast tenderness or discharge -breathing problems -changes in vision -depression -feeling faint or lightheaded, falls -fever -pain in the abdomen, chest, groin, or leg -problems with balance, talking, walking -unusually weak or tired -yellowing of the eyes or skin Side effects that usually do not require medical attention (report to your doctor or health care professional if they continue or are bothersome): -acne -fluid retention and swelling -headache -irregular periods, spotting, or absent periods -temporary pain, itching, or skin reaction at site where injected -weight gain This list may not describe all possible side effects. Call your doctor for medical advice about side effects. You may report side effects to FDA at 1-800-FDA-1088. Where should I keep my medicine? This does not apply. The injection will be given to you by a health care professional. NOTE: This sheet is a summary. It may not cover all possible information. If you have questions about this medicine, talk to your doctor, pharmacist, or health care provider.  2013, [employer]. (01/05/2009  6:37:56 PM)

## 2013-08-04 NOTE — Addendum Note (Signed)
Addended by: Burna Mortimer E on: 08/04/2013 11:12 AM   Modules accepted: Orders

## 2013-08-04 NOTE — Progress Notes (Signed)
Patient ID: Jasmine Henry, female   DOB: 1981-05-03, 32 y.o.   MRN: [id number]  Subjective:   CC: Restart depo provera    HPI:   1. Pt would like to restart depo-provera for birth control. She had been getting depo-provera shots for five years with no bad side effects and stopped having a period while on the shots. It has been five years since her last shot. She stopped getting it because of concern about no period, but then got pregnant.   Review of Systems - Per HPI.   PMH: - [id number] (all vaginal, spontaneous abortion <20 weeks) - LMP beginning of July; usually every 30-37 days, lasting 3-4 days, not very heavy, just spotting. - No H/o STD. - No h/o bleeding disorder - 1 month ago had breast implant, not needing pain med now.  FH: - Sister died at 5 years for colon cancer. Father has brain tumor. Saw a GI doctor 6 months ago, who said pt was too young for colonoscopy and told her that if bowel movements irregular, revisit colon screening.  SH: - 1 sexual partner currently, 2 in her life. No other protection.     Objective:  Physical Exam BP 116/88  Pulse 89  Ht 5\' 3"  (1.6 m)  Wt 113 lb (51.256 kg)  BMI 20.02 kg/m2 GEN: NAD, well-developed, well-groomed female HEENT: Atraumatic, normocephalic, neck supple, EOMI, sclera clear  PULM: normal effort ABD: nondistended-appearing SKIN: No rash or cyanosis; warm and well-perfused EXTR: No lower extremity edema or calf tenderness PSYCH: Mood and affect euthymic, normal rate and volume of speech NEURO: Awake, alert, no focal deficits grossly, normal speech     Urine pregnancy test performed and negative.  Assessment:     Jasmine Henry is a 32 y.o. female with no significant PMH here for depo-provera shot for birth control.    Plan:     # See problem list for problem-specific plans. - Discuss colon cancer screening again with PCP.  Leona Singleton, MD 08/04/2013 11:02 AM  FMTS Attending  Note: Nicolette Bang I  have seen  and examined this patient, reviewed their chart. I have discussed this patient with the resident. I agree with the resident's findings, assessment and care plan.  Urine pregnancy test negative.

## 2013-08-04 NOTE — Assessment & Plan Note (Signed)
Previously using this contraceptive method for 5 years, stopped 5 years ago due to concern about no periods. Had no abnormal side effects from shots. Wants to resume.  - Urine pregnancy test negative - Depo-provera shot administered today - Return precautions reviewed - Return every 3 months

## 2013-09-01 ENCOUNTER — Telehealth: Payer: Self-pay | Admitting: Family Medicine

## 2013-09-01 NOTE — Telephone Encounter (Signed)
Patient recently had her Depo shot and starting 2 weeks ago, her period started and it is still going and she wants to know if that is normal.

## 2013-09-08 NOTE — Telephone Encounter (Signed)
Received Depo 3 weeks ago.  Has been having spotting and wants to know if this is normal.  Informed patient that this is a side effect of Depo and should get better as her body adjusts to the med.  Will mail Depo sheet to patient and she will call back with any additional questions or concerns.  Gaylene Brooks, RN

## 2013-10-25 ENCOUNTER — Ambulatory Visit (INDEPENDENT_AMBULATORY_CARE_PROVIDER_SITE_OTHER): Payer: Medicaid Other | Admitting: *Deleted

## 2013-10-25 DIAGNOSIS — Z3049 Encounter for surveillance of other contraceptives: Secondary | ICD-10-CM

## 2013-10-25 DIAGNOSIS — Z3042 Encounter for surveillance of injectable contraceptive: Secondary | ICD-10-CM

## 2013-10-25 MED ORDER — MEDROXYPROGESTERONE ACETATE 150 MG/ML IM SUSP
150.0000 mg | Freq: Once | INTRAMUSCULAR | Status: AC
  Administered 2013-10-25: 150 mg via INTRAMUSCULAR

## 2013-10-25 NOTE — Progress Notes (Signed)
Patient in today for depo. Injection given in right ventrogluteal, no complaints noted, site unremarkable. Next depo due January 13 - January 27, patient aware.

## 2014-01-10 ENCOUNTER — Ambulatory Visit (INDEPENDENT_AMBULATORY_CARE_PROVIDER_SITE_OTHER): Payer: Medicaid Other | Admitting: *Deleted

## 2014-01-10 DIAGNOSIS — Z3049 Encounter for surveillance of other contraceptives: Secondary | ICD-10-CM

## 2014-01-10 DIAGNOSIS — Z3042 Encounter for surveillance of injectable contraceptive: Secondary | ICD-10-CM

## 2014-01-10 MED ORDER — MEDROXYPROGESTERONE ACETATE 150 MG/ML IM SUSP
150.0000 mg | Freq: Once | INTRAMUSCULAR | Status: AC
  Administered 2014-01-10: 150 mg via INTRAMUSCULAR

## 2014-01-10 NOTE — Progress Notes (Signed)
Patient in today for depo. Injection given in left ventrogluteal, patient without complaints, site unremarkable. Next depo due March 31 - April 13, patient aware.

## 2014-01-11 ENCOUNTER — Ambulatory Visit: Payer: Medicaid Other

## 2014-03-14 ENCOUNTER — Ambulatory Visit (INDEPENDENT_AMBULATORY_CARE_PROVIDER_SITE_OTHER): Payer: Medicaid Other | Admitting: Family Medicine

## 2014-03-14 ENCOUNTER — Encounter: Payer: Self-pay | Admitting: Family Medicine

## 2014-03-14 VITALS — BP 121/83 | HR 94 | Temp 99.0°F | Wt 128.0 lb

## 2014-03-14 DIAGNOSIS — Z309 Encounter for contraceptive management, unspecified: Secondary | ICD-10-CM

## 2014-03-14 DIAGNOSIS — Z3046 Encounter for surveillance of implantable subdermal contraceptive: Secondary | ICD-10-CM

## 2014-03-14 DIAGNOSIS — Z30017 Encounter for initial prescription of implantable subdermal contraceptive: Secondary | ICD-10-CM | POA: Insufficient documentation

## 2014-03-14 LAB — POCT URINE PREGNANCY: PREG TEST UR: NEGATIVE

## 2014-03-14 MED ORDER — ETONOGESTREL 68 MG ~~LOC~~ IMPL
68.0000 mg | DRUG_IMPLANT | Freq: Once | SUBCUTANEOUS | Status: AC
  Administered 2014-03-14: 68 mg via SUBCUTANEOUS

## 2014-03-14 NOTE — Progress Notes (Signed)
Patient ID: Jasmine Henry, female   DOB: August 30, 1981, [age] y.o.   MRN: [id number] Subjective:    Jasmine Henry is a 33 y.o. female who presents for contraception counseling. The patient has no complaints today. The patient is sexually active with same partner for a year,uses Depo for contraception,will like to change to Nexplanon. Pertinent past medical history: none.  Menstrual History: OB History   Grav Para Term Preterm Abortions TAB SAB Ect Mult Living   4 4 2       4      G5P4  She has 4 living children with one miscarriage at first trimester 3 yrs ago. Menarche age: Unsure    Patient spotting for the last 2 days, does not normally have period on depo. The following portions of the patient's history were reviewed and updated as appropriate: allergies, current medications, past family history, past medical history, past social history, past surgical history and problem list.  Review of Systems Pertinent items are noted in HPI.  No vaginal discharge,no recent STD or UTI.No HA.  Objective:     Physical Exam  Nursing note and vitals reviewed. Constitutional: She appears well-developed.  Cardiovascular: Normal rate, regular rhythm and normal heart sounds.   No murmur heard. Pulmonary/Chest: Effort normal and breath sounds normal. No respiratory distress. She has no wheezes.  Abdominal: Soft. Bowel sounds are normal. She exhibits no distension and no mass. There is no tenderness.  Musculoskeletal: Normal range of motion.        Assessment:    33 y.o., starting Nexplanon, no contraindications.   Plan:    All questions answered. Contraception: Nexplanon. Agricultural engineerducational material distributed. Follow up in about 2-4 weeks. Pregnancy test, result: negative.  F/U in 2-4 for PAP.

## 2014-03-14 NOTE — Assessment & Plan Note (Addendum)
Counseling done on various birth control option. She was on Depo and would like to change to Nexplanon. S/E of contractive discussed with the patient.   Procedure: Consent obtained for the procedure. Left arm draped and cleaned thoroughly with iodine and alcohol swab. 1% lidocaine with epinephrine was injected into her left arm just 8cm above her medial epicondyle. Demarcation made with sterile maker at the insertion site. Nexplanon was inserted into her left upper arm. Patient was able to palpate the nexplanon rod. Procedure was well tolerated with no complication and very minimal bleeding. More than 30 min spent for the face to face encounter,procedure and coordination of care.

## 2014-03-14 NOTE — Patient Instructions (Signed)
Levonorgestrel intrauterine device (IUD) What is this medicine? LEVONORGESTREL IUD (LEE voe nor jes trel) is a contraceptive (birth control) device. The device is placed inside the uterus by a healthcare professional. It is used to prevent pregnancy and can also be used to treat heavy bleeding that occurs during your period. Depending on the device, it can be used for 3 to 5 years. This medicine may be used for other purposes; ask your health care provider or pharmacist if you have questions. COMMON BRAND NAME(S): Mirena, Skyla What should I tell my health care provider before I take this medicine? They need to know if you have any of these conditions: -abnormal Pap smear -cancer of the breast, uterus, or cervix -diabetes -endometritis -genital or pelvic infection now or in the past -have more than one sexual partner or your partner has more than one partner -heart disease -history of an ectopic or tubal pregnancy -immune system problems -IUD in place -liver disease or tumor -problems with blood clots or take blood-thinners -use intravenous drugs -uterus of unusual shape -vaginal bleeding that has not been explained -an unusual or allergic reaction to levonorgestrel, other hormones, silicone, or polyethylene, medicines, foods, dyes, or preservatives -pregnant or trying to get pregnant -breast-feeding How should I use this medicine? This device is placed inside the uterus by a health care professional. Talk to your pediatrician regarding the use of this medicine in children. Special care may be needed. Overdosage: If you think you have taken too much of this medicine contact a poison control center or emergency room at once. NOTE: This medicine is only for you. Do not share this medicine with others. What if I miss a dose? This does not apply. What may interact with this medicine? Do not take this medicine with any of the following  medications: -amprenavir -bosentan -fosamprenavir This medicine may also interact with the following medications: -aprepitant -barbiturate medicines for inducing sleep or treating seizures -bexarotene -griseofulvin -medicines to treat seizures like carbamazepine, ethotoin, felbamate, oxcarbazepine, phenytoin, topiramate -modafinil -pioglitazone -rifabutin -rifampin -rifapentine -some medicines to treat HIV infection like atazanavir, indinavir, lopinavir, nelfinavir, tipranavir, ritonavir -St. John's wort -warfarin This list may not describe all possible interactions. Give your health care provider a list of all the medicines, herbs, non-prescription drugs, or dietary supplements you use. Also tell them if you smoke, drink alcohol, or use illegal drugs. Some items may interact with your medicine. What should I watch for while using this medicine? Visit your doctor or health care professional for regular check ups. See your doctor if you or your partner has sexual contact with others, becomes HIV positive, or gets a sexual transmitted disease. This product does not protect you against HIV infection (AIDS) or other sexually transmitted diseases. You can check the placement of the IUD yourself by reaching up to the top of your vagina with clean fingers to feel the threads. Do not pull on the threads. It is a good habit to check placement after each menstrual period. Call your doctor right away if you feel more of the IUD than just the threads or if you cannot feel the threads at all. The IUD may come out by itself. You may become pregnant if the device comes out. If you notice that the IUD has come out use a backup birth control method like condoms and call your health care provider. Using tampons will not change the position of the IUD and are okay to use during your period. What side effects may I   notice from receiving this medicine? Side effects that you should report to your doctor or  health care professional as soon as possible: -allergic reactions like skin rash, itching or hives, swelling of the face, lips, or tongue -fever, flu-like symptoms -genital sores -high blood pressure -no menstrual period for 6 weeks during use -pain, swelling, warmth in the leg -pelvic pain or tenderness -severe or sudden headache -signs of pregnancy -stomach cramping -sudden shortness of breath -trouble with balance, talking, or walking -unusual vaginal bleeding, discharge -yellowing of the eyes or skin Side effects that usually do not require medical attention (report to your doctor or health care professional if they continue or are bothersome): -acne -breast pain -change in sex drive or performance -changes in weight -cramping, dizziness, or faintness while the device is being inserted -headache -irregular menstrual bleeding within first 3 to 6 months of use -nausea This list may not describe all possible side effects. Call your doctor for medical advice about side effects. You may report side effects to FDA at 1-800-FDA-1088. Where should I keep my medicine? This does not apply. NOTE: This sheet is a summary. It may not cover all possible information. If you have questions about this medicine, talk to your doctor, pharmacist, or health care provider.  2014, [employer] Standard. (2012-01-15 13:54:04)  

## 2014-06-15 ENCOUNTER — Encounter: Payer: Self-pay | Admitting: Family Medicine

## 2014-06-15 ENCOUNTER — Ambulatory Visit (INDEPENDENT_AMBULATORY_CARE_PROVIDER_SITE_OTHER): Payer: Medicaid Other | Admitting: Family Medicine

## 2014-06-15 VITALS — BP 116/79 | HR 92 | Temp 98.6°F | Ht 63.0 in | Wt 125.0 lb

## 2014-06-15 DIAGNOSIS — L259 Unspecified contact dermatitis, unspecified cause: Secondary | ICD-10-CM

## 2014-06-15 MED ORDER — DIBUCAINE 1 % RE OINT: 1.0000 "application " | TOPICAL_OINTMENT | RECTAL | Status: DC | PRN

## 2014-06-15 MED ORDER — TRIAMCINOLONE ACETONIDE 0.1 % EX CREA: 1.0000 "application " | TOPICAL_CREAM | Freq: Two times a day (BID) | CUTANEOUS | Status: DC

## 2014-06-15 NOTE — Progress Notes (Signed)
  Tana ConchStephen Hunter, MD Phone: [phone]  Subjective:   Jasmine Henry is a 33 y.o. year old very pleasant female patient who presents with the following:  Rash on hands Numerous small itchy bumps have been noted on fingers. Works as Agricultural engineermanicurist. Denies this happening before. No pain of lesions. Started wearing gloves and symptoms have not worsened since that time. Has not tried any medications. Ongoing for a week, stable since started wearing gloves. Does think fingers get mildly swollen. No changes in materials patient uses at homes (detergents/soaps/etc). Denies new contacts   ROS-not ill appearing, no fever/chills. No new medications. Not immunocompromised. No mucus membrane involvement. No paresthesias.  Past Medical History- history hemorrhoids, zoster ophthalmicus history Social history- works as Agricultural engineermanicurist  Medications- none at time of visit    Objective: BP 116/79  Pulse 92  Temp(Src) 98.6 F (37 C) (Oral)  Ht 5\' 3"  (1.6 m)  Wt 125 lb (56.7 kg)  BMI 22.15 kg/m2 Gen: NAD, resting comfortably on table CV: RRR no murmurs rubs or gallops Lungs: CTAB no crackles, wheeze, rhonchi Skin: warm, dry, multiple papules on bilateral fingers. Minimal erythema.  Neuro: intact distal sensation.   Assessment/Plan:  Contact dermatitis of fingers Suspect due to contact from chemicals at work. Has stabilized since wearing gloves. Encouraged to continue to use gloves at work and advised steroid cream BID 7-10 days. May use 2 cycles with 1 week break and follow up if not improved, sooner if worsens. If becomes long term issue, could consider derm referral to identify troublesome chemicals or contacts.   No active hemorrhoids but requests prn refill in case recurs Meds ordered this encounter  Medications  . dibucaine (NUPERCAINAL) 1 % OINT    Sig: Place 1 application rectally as needed for pain.    Dispense:  28.4 g    Refill:  1  . triamcinolone cream (KENALOG) 0.1 %    Sig: Apply 1 application  topically 2 (two) times daily. Use 10 days then give at least a week break    Dispense:  45 g    Refill:  1

## 2014-06-15 NOTE — Patient Instructions (Signed)
Suspect your rash/itchign is due to some chemical you are working with at work. I would wear glvoes at work (since they dont seem to make things worse). Use cream for 7-10 days to see if improves. If you do not improve after 2 cycles of medicine, return to care or sooner if worsens  Contact Dermatitis Contact dermatitis is a reaction to certain substances that touch the skin. Contact dermatitis can be either irritant contact dermatitis or allergic contact dermatitis. Irritant contact dermatitis does not require previous exposure to the substance for a reaction to occur.Allergic contact dermatitis only occurs if you have been exposed to the substance before. Upon a repeat exposure, your body reacts to the substance.  CAUSES  Many substances can cause contact dermatitis. Irritant dermatitis is most commonly caused by repeated exposure to mildly irritating substances, such as:  Makeup.  Soaps.  Detergents.  Bleaches.  Acids.  Metal salts, such as nickel. Allergic contact dermatitis is most commonly caused by exposure to:  Poisonous plants.  Chemicals (deodorants, shampoos).  Jewelry.  Latex.  Neomycin in triple antibiotic cream.  Preservatives in products, including clothing. SYMPTOMS  The area of skin that is exposed may develop:  Dryness or flaking.  Redness.  Cracks.  Itching.  Pain or a burning sensation.  Blisters. With allergic contact dermatitis, there may also be swelling in areas such as the eyelids, mouth, or genitals.  DIAGNOSIS  Your caregiver can usually tell what the problem is by doing a physical exam. In cases where the cause is uncertain and an allergic contact dermatitis is suspected, a patch skin test may be performed to help determine the cause of your dermatitis. TREATMENT Treatment includes protecting the skin from further contact with the irritating substance by avoiding that substance if possible. Barrier creams, powders, and gloves may be  helpful. Your caregiver may also recommend:  Steroid creams or ointments applied 2 times daily. For best results, soak the rash area in cool water for 20 minutes. Then apply the medicine. Cover the area with a plastic wrap. You can store the steroid cream in the refrigerator for a "chilly" effect on your rash. That may decrease itching. Oral steroid medicines may be needed in more severe cases.  Antibiotics or antibacterial ointments if a skin infection is present.  Antihistamine lotion or an antihistamine taken by mouth to ease itching.  Lubricants to keep moisture in your skin.  Burow's solution to reduce redness and soreness or to dry a weeping rash. Mix one packet or tablet of solution in 2 cups cool water. Dip a clean washcloth in the mixture, wring it out a bit, and put it on the affected area. Leave the cloth in place for 30 minutes. Do this as often as possible throughout the day.  Taking several cornstarch or baking soda baths daily if the area is too large to cover with a washcloth. Harsh chemicals, such as alkalis or acids, can cause skin damage that is like a burn. You should flush your skin for 15 to 20 minutes with cold water after such an exposure. You should also seek immediate medical care after exposure. Bandages (dressings), antibiotics, and pain medicine may be needed for severely irritated skin.  HOME CARE INSTRUCTIONS  Avoid the substance that caused your reaction.  Keep the area of skin that is affected away from hot water, soap, sunlight, chemicals, acidic substances, or anything else that would irritate your skin.  Do not scratch the rash. Scratching may cause the rash to  become infected.  You may take cool baths to help stop the itching.  Only take over-the-counter or prescription medicines as directed by your caregiver.  See your caregiver for follow-up care as directed to make sure your skin is healing properly. SEEK MEDICAL CARE IF:   Your condition is not  better after 3 days of treatment.  You seem to be getting worse.  You see signs of infection such as swelling, tenderness, redness, soreness, or warmth in the affected area.  You have any problems related to your medicines. Document Released: 12/12/2000 Document Revised: 03/08/2012 Document Reviewed: 05/20/2011 [facility] Patient Information 2015 [place], [place]. This information is not intended to replace advice given to you by your health care provider. Make sure you discuss any questions you have with your health care provider.

## 2014-06-28 ENCOUNTER — Telehealth: Payer: Self-pay | Admitting: Family Medicine

## 2014-06-28 NOTE — Telephone Encounter (Signed)
There are more bumps on her fingers. It is itching so bad she cant sleep Please advise

## 2014-06-29 ENCOUNTER — Encounter: Payer: Self-pay | Admitting: Family Medicine

## 2014-06-29 ENCOUNTER — Ambulatory Visit (INDEPENDENT_AMBULATORY_CARE_PROVIDER_SITE_OTHER): Payer: Medicaid Other | Admitting: Family Medicine

## 2014-06-29 VITALS — BP 111/79 | HR 89 | Temp 98.0°F | Resp 16 | Wt 124.0 lb

## 2014-06-29 DIAGNOSIS — L259 Unspecified contact dermatitis, unspecified cause: Secondary | ICD-10-CM

## 2014-06-29 MED ORDER — BETAMETHASONE DIPROPIONATE 0.05 % EX CREA: TOPICAL_CREAM | Freq: Two times a day (BID) | CUTANEOUS | Status: DC

## 2014-06-29 NOTE — Assessment & Plan Note (Signed)
Minimal improvement with using gloves (latex) and triamcinolone 0.1% (medium potency). Location and appearance still seems consistent with contact dermatitis that may not be resolving; no obvious triggers. 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  P: high potency steroid cream: betamethasone 0.05% BID, benadryl PO for itching at night. If still not improved within 1-2 weeks may need referral for derm/allergist.

## 2014-06-29 NOTE — Progress Notes (Signed)
Patient ID: Jasmine Henry, female   DOB: 1981-10-21, 33 y.o.   MRN: [id number] I have reviewed the encounter performed by Dr. Waynetta SandyWight and agrees with his documentation and plan.

## 2014-06-29 NOTE — Patient Instructions (Signed)
We will change the cream to betamethasone 0.05%, use twice a day on the affected fingers. Pay attention to any skin getting whiter, as this can be due to the cream, if this happens stop using the cream.  For itchy, try taking some benadryl at night (the medication can make you sleepy).  Follow up in 1-2 weeks if not getting better.

## 2014-06-29 NOTE — Progress Notes (Signed)
Patient ID: Jasmine Henry, female   DOB: [birth year], 33 y.o.   MRN: [name]   Subjective:    Patient ID: Jasmine CoronaHom Henry, female    DOB: [birth year], 33 y.o.   MRN: [id number]  HPI  CC: rash on hands  # Rash:  Seen in clinic on 6/18, diagnosed with contact dermatitis and given triamcinolone 0.1% cream to use  Since that visit has been using gloves at work ([employer]), using cream 2 times a day and benadryl cream, also started salt baths for hands for past 2 days  Thinks it has continued to get a little worse, more itchy (especially last night), a little more swelling  Denies any changes in: food, drink, creams or products other than prescribed steroid, soaps or other hygiene care products, dish or laundry detergents. ROS: no fevers/chills, pain in joints, rashes on other parts of body  Review of Systems   See HPI for ROS. Objective:  BP 111/79  Pulse 89  Temp(Src) 98 F (36.7 C) (Oral)  Resp 16  Wt 124 lb (56.246 kg)  SpO2 99%  Breastfeeding? No  General: NAD CV: RRR, normal heart sounds, no murmurs. 2+ radial pulses bilaterally. Resp: CTAB, normal effort Skin: multiple nodular/slightly vesicular bumps located at distal finger tips of all fingers on both hands. A few small hyperpigmented dots on fingers (<341mm in size). Mild erythema but no warmth. No joint swelling of DIP/PIP joints. No rashes on dorsum or palmar surfaces of hands or extending up to wrist. Areas are nontender. Neuro: alert, oriented. Normal grip strength bilaterally, finger-to-thumb testing normal. Grossly normal sensation to light touch in both hands. Assessment & Plan:  See Problem List Documentation

## 2014-07-05 ENCOUNTER — Telehealth: Payer: Self-pay | Admitting: Family Medicine

## 2014-07-05 MED ORDER — FLUOCINONIDE 0.05 % EX CREA: 1.0000 "application " | TOPICAL_CREAM | Freq: Two times a day (BID) | CUTANEOUS | Status: DC

## 2014-07-05 NOTE — Telephone Encounter (Signed)
Will forward to RN to see if she has seen this PA. Burnard HawthorneJazmin Alonzo Loving,CMA

## 2014-07-05 NOTE — Telephone Encounter (Signed)
Changed prescription to steroid covered by formulary. Can you please call patient and let them know it was changed? Same dosage and frequency (thin layer covering affected areas twice a day). Thank you. -DrW

## 2014-07-05 NOTE — Telephone Encounter (Signed)
Pt was given a prescription for Diprolene on 7/2 but her insurance which is medicaid needs a prior authorization for this medication. The pharmacy sent a fax to us last week 7/2, but has not had any response from us. The pharmacy is sending another fax today. She really needs this medication. jw

## 2014-07-05 NOTE — Telephone Encounter (Signed)
[facility]. Formulary and PA form placed in provider box for completion. Clovis PuMartin, Mahamed Zalewski L, RN

## 2014-07-06 NOTE — Telephone Encounter (Signed)
Pt informed and agreeable. Jasmine Henry  

## 2014-07-17 ENCOUNTER — Ambulatory Visit (INDEPENDENT_AMBULATORY_CARE_PROVIDER_SITE_OTHER): Payer: Medicaid Other | Admitting: Family Medicine

## 2014-07-17 ENCOUNTER — Encounter: Payer: Self-pay | Admitting: Family Medicine

## 2014-07-17 VITALS — BP 123/83 | HR 80 | Temp 98.6°F | Wt 126.0 lb

## 2014-07-17 DIAGNOSIS — L259 Unspecified contact dermatitis, unspecified cause: Secondary | ICD-10-CM

## 2014-07-17 MED ORDER — METHYLPREDNISOLONE 4 MG PO KIT: PACK | ORAL | Status: DC

## 2014-07-17 NOTE — Assessment & Plan Note (Signed)
Unknown allergenic exposure, but rash is very typical for contact dermatitis. No systemic symptoms. Failed topical therapy, will prescribe medrol dose pack. Pt advised to call office if not improved at the end of course for evaluation of alternative causes (e.g. Skin scraping and KOH prep vs. Bx)

## 2014-07-17 NOTE — Progress Notes (Signed)
Patient ID: Jasmine Henry, female   DOB: 06-15-1981, 33 y.o.   MRN: [id number]   Subjective:   Jasmine Henry is a 33 y.o. female here for rash on her hands and ankle pain.   This is Jasmine Henry's 3rd visit for this problem and reports symptoms have remained stable, including intense pruritus bothersome to the point of affecting sleeping and her job in a [employer]. She has worked at the same [employer] for 15 years and never had this. Wearing gloves for the past month has not helped. Creams prescribed previously seemed to only help somewhat and transiently. Warm salt water soaks help. No new detergents, lotions, manicure/pedicure supplies. No fever, chills, nausea, vomiting, diarrhea, or rashes any where else.   She reports right ankle pain after twisting it 3 nights ago. It is mild, improving, she has taken nothing for it. It is not affecting mobility.   Review of Systems:  Per HPI. All other systems reviewed and are negative.    Past Medical History: Patient Active Problem List   Diagnosis Date Noted  . Contact dermatitis 06/29/2014  . Encounter for contraceptive management 03/14/2014  . External hemorrhoids 06/14/2013  . 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 History of colon cancer 09/07/2012  . Physical exam, annual 09/07/2012    Medications: reviewed and updated Current Outpatient Prescriptions  Medication Sig Dispense Refill  . dibucaine (NUPERCAINAL) 1 % OINT Place 1 application rectally as needed for pain.  28.4 g  1  . fluocinonide cream (LIDEX) 0.05 % Apply 1 application topically 2 (two) times daily.  30 g  0  . methylPREDNISolone (MEDROL DOSEPAK) 4 MG tablet follow package directions  21 tablet  0   No current facility-administered medications for this visit.    Objective:  BP 123/83  Pulse 80  Temp(Src) 98.6 F (37 C) (Oral)  Wt 126 lb (57.153 kg)  Gen: Well-appearing 33 y.o. female in NAD Skin: small (<571mm) punctate red-brown lesions at fingertips on all 10 fingers with normally  pigmented papules on sides of fingers. Scattered papules on palm of R hand as well. No vesicles. No general erythema or warmth.     Assessment:     Jasmine Henry is a 33 y.o. female here for refractory contact dermatitis.    Plan:     See problem list for problem-specific plans.

## 2014-07-28 ENCOUNTER — Telehealth: Payer: Self-pay | Admitting: Family Medicine

## 2014-07-28 MED ORDER — CLOBETASOL PROPIONATE 0.05 % EX LOTN: 1.0000 "application " | TOPICAL_LOTION | Freq: Two times a day (BID) | CUTANEOUS | Status: DC

## 2014-07-28 NOTE — Telephone Encounter (Signed)
Finger still itching and she has used all the medicine She said the dr said she didn't have to be seen again. Just call in and ask for refill

## 2014-07-28 NOTE — Telephone Encounter (Signed)
I ordered a very high potency topical steroid. Please tell her that I sent this in and to schedule an appointment for a biopsy if this is ineffective. Thank you!

## 2014-07-28 NOTE — Telephone Encounter (Signed)
Will forward to Dr. Jarvis NewcomerGrunz who saw patient last for this. Pressley Tadesse,CMA

## 2014-07-31 NOTE — Telephone Encounter (Signed)
[facility] for Clobetasol prop lotion. Formulary and PA form placed in provider box for completion. Clovis PuMartin, Tamika L, RN

## 2014-07-31 NOTE — Telephone Encounter (Signed)
Spoke with pt.  She states that per pharmacy the medication needs to be prior authorized.  Will forward to Dr. Jarvis NewcomerGrunz and Dorisann Framesamika Martin, RN. Fleeger, Maryjo RochesterJessica Dawn

## 2014-08-01 ENCOUNTER — Other Ambulatory Visit: Payer: Self-pay | Admitting: Family Medicine

## 2014-08-01 MED ORDER — CLOBETASOL PROPIONATE 0.05 % EX CREA: 1.0000 "application " | TOPICAL_CREAM | Freq: Two times a day (BID) | CUTANEOUS | Status: DC

## 2014-08-01 NOTE — Telephone Encounter (Signed)
Patient informed Rx/cream was sent to pharmacy.Jasmine Henry, Virgel BouquetGiovanna S

## 2014-08-01 NOTE — Progress Notes (Signed)
Received notice of denial due to non-coverage of the LOTION preparation by insurance. I have ordered the more potent cream. The patient will need to use this for 3 weeks and follow up with me. For extra potency she should apply the cream after moistening the skin (e.g. after showers) and can put gloves on afterward to increase the effect. She should not do this for more than 3 weeks without consulting with me for follow up.

## 2014-08-01 NOTE — Telephone Encounter (Signed)
Pt informed that Dr. Jarvis NewcomerGrunz, switched medication to cream instead of lotion.  Clovis PuMartin, Tamika L, RN

## 2014-08-01 NOTE — Telephone Encounter (Signed)
Pt called again regarding medication needed for her hands itching extremely bad.  Pt informed nurse is waiting for provider to complete PA form.  Will forward to provider.  Clovis PuMartin, Vernica Wachtel L, RN

## 2014-08-16 ENCOUNTER — Ambulatory Visit (INDEPENDENT_AMBULATORY_CARE_PROVIDER_SITE_OTHER): Payer: Medicaid Other | Admitting: Family Medicine

## 2014-08-16 ENCOUNTER — Encounter: Payer: Self-pay | Admitting: Family Medicine

## 2014-08-16 VITALS — BP 125/84 | HR 85 | Temp 98.2°F | Ht 62.0 in | Wt 128.7 lb

## 2014-08-16 DIAGNOSIS — L259 Unspecified contact dermatitis, unspecified cause: Secondary | ICD-10-CM

## 2014-08-16 MED ORDER — PREDNISONE 50 MG PO TABS: 50.0000 mg | ORAL_TABLET | Freq: Every day | ORAL | Status: DC

## 2014-08-16 MED ORDER — DIPHENHYDRAMINE HCL 25 MG PO TABS: 25.0000 mg | ORAL_TABLET | Freq: Four times a day (QID) | ORAL | Status: DC | PRN

## 2014-08-16 NOTE — Patient Instructions (Signed)
I am prescribing a stronger steroid by mouth. Take it for a week and then come back to see us at the end of that week so that we can evaluate your progress and write a taper for the steroids if you have improved.  Please coat your hands in vaseline nightly and wear gloves to keep the moisture in overnight. This should help with the dryness. You can also take benadryl as needed for itching.

## 2014-08-16 NOTE — Assessment & Plan Note (Signed)
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 - higher potency steroid burst, 50mg  prednisone x7 days then RTC - if improved with this would consider tapering over 2-3 weeks - if no improvement consider latex as possible allergen, have pt switch to nitrile gloves at work - also would likely move to KOH, biopsy, bacterial culture or trial of antimicrobial if no improvement with above - continue clobetasol - benadryl prn itching

## 2014-08-16 NOTE — Progress Notes (Signed)
   Subjective:    Patient ID: Bryson CoronaHom Mcclurg, female    DOB: [birth year], 31 y.o.   MRN: [id number]  Rash   Pt presents for itching rash of hands and fingers for the past 3 months. Has been seen in clinic several times and given topical and systemic steroids without any improvement. Wears gloves at work all the time now. No moisturizers. No fevers or systemic symptoms. Severe itching, can't help but scratch and sometimes takes skin off.   Review of Systems  Skin: Positive for rash.   See HPI    Objective:   Physical Exam  Nursing note and vitals reviewed. Constitutional: She is oriented to person, place, and time. She appears well-developed and well-nourished. No distress.  HENT:  Head: Normocephalic and atraumatic.  Eyes: Right eye exhibits no discharge. Left eye exhibits no discharge. No scleral icterus.  Cardiovascular: Normal rate.   Pulmonary/Chest: Effort normal.  Neurological: She is alert and oriented to person, place, and time.  Skin: Rash noted. Rash is maculopapular. Rash is not vesicular. She is not diaphoretic.     Psychiatric: She has a normal mood and affect. Her behavior is normal.          Assessment & Plan:

## 2014-10-26 ENCOUNTER — Ambulatory Visit (INDEPENDENT_AMBULATORY_CARE_PROVIDER_SITE_OTHER): Payer: Medicaid Other | Admitting: Family Medicine

## 2014-10-26 ENCOUNTER — Other Ambulatory Visit (HOSPITAL_COMMUNITY)
Admission: RE | Admit: 2014-10-26 | Discharge: 2014-10-26 | Disposition: A | Discharge status: Home / Self Care | Payer: Medicaid Other | Source: Ambulatory Visit | Attending: Family Medicine | Admitting: Family Medicine

## 2014-10-26 VITALS — BP 114/84 | HR 90 | Temp 98.1°F | Wt 135.0 lb

## 2014-10-26 DIAGNOSIS — Z3046 Encounter for surveillance of implantable subdermal contraceptive: Secondary | ICD-10-CM

## 2014-10-26 DIAGNOSIS — N898 Other specified noninflammatory disorders of vagina: Secondary | ICD-10-CM

## 2014-10-26 DIAGNOSIS — Z30011 Encounter for initial prescription of contraceptive pills: Secondary | ICD-10-CM

## 2014-10-26 DIAGNOSIS — Z1151 Encounter for screening for human papillomavirus (HPV): Secondary | ICD-10-CM | POA: Diagnosis present

## 2014-10-26 DIAGNOSIS — Z01419 Encounter for gynecological examination (general) (routine) without abnormal findings: Secondary | ICD-10-CM | POA: Insufficient documentation

## 2014-10-26 DIAGNOSIS — Z308 Encounter for other contraceptive management: Secondary | ICD-10-CM

## 2014-10-26 DIAGNOSIS — Z124 Encounter for screening for malignant neoplasm of cervix: Secondary | ICD-10-CM | POA: Insufficient documentation

## 2014-10-26 LAB — POCT WET PREP (WET MOUNT): CLUE CELLS WET PREP WHIFF POC: NEGATIVE

## 2014-10-26 MED ORDER — NORGESTIM-ETH ESTRAD TRIPHASIC 0.18/0.215/0.25 MG-25 MCG PO TABS: 1.0000 | ORAL_TABLET | Freq: Every day | ORAL | Status: DC

## 2014-10-26 NOTE — Assessment & Plan Note (Signed)
PAP done today. I will call with test result.

## 2014-10-26 NOTE — Assessment & Plan Note (Signed)
GC Chlamydia tested today, I will call her with result. Wet prep done was normal.

## 2014-10-26 NOTE — Patient Instructions (Signed)
  Your Nexplanon removal went well without problem, we also did your PAP. Please start oral birth control prescribed right away, use condom for the next 7-14 days initially to prevent unexpected pregnancy. For the wound at the nexplanon removal site, keep dressing on for about 1-2 days, keep dry and clean, call if painful or bleeding or having any discharge from the site or fever.    Wound Care Wound care helps prevent pain and infection.  You may need a tetanus shot if:  You cannot remember when you had your last tetanus shot.  You have never had a tetanus shot.  The injury broke your skin. If you need a tetanus shot and you choose not to have one, you may get tetanus. Sickness from tetanus can be serious. HOME CARE   Only take medicine as told by your doctor.  Clean the wound daily with mild soap and water.  Change any bandages (dressings) as told by your doctor.  Put medicated cream and a bandage on the wound as told by your doctor.  Change the bandage if it gets wet, dirty, or starts to smell.  Take showers. Do not take baths, swim, or do anything that puts your wound under water.  Rest and raise (elevate) the wound until the pain and puffiness (swelling) are better.  Keep all doctor visits as told. GET HELP RIGHT AWAY IF:   Yellowish-white fluid (pus) comes from the wound.  Medicine does not lessen your pain.  There is a red streak going away from the wound.  You have a fever. MAKE SURE YOU:   Understand these instructions.  Will watch your condition.  Will get help right away if you are not doing well or get worse. Document Released: 09/23/2008 Document Revised: 03/08/2012 Document Reviewed: 04/20/2011 [facility] Patient Information 2015 [place], [place]. This information is not intended to replace advice given to you by your health care provider. Make sure you discuss any questions you have with your health care provider.

## 2014-10-26 NOTE — Progress Notes (Signed)
Subjective:     Patient ID: Jasmine Henry, female   DOB: 1981/12/19, 33 y.o.   MRN: [id number]  HPI Nexplanon removal/Contraception: No vaginal discharge, no vaginal bleeding or pelvic pain, LMP: long time ago. She has been gaining weight since started on Nexplanon from 128 to 135 lbs today. She will like to start OCP instead. Denies any other concern. PAP: Need PAP  No current outpatient prescriptions on file prior to visit.   No current facility-administered medications on file prior to visit.   Past Medical History  Diagnosis Date  . No pertinent past medical history       Review of Systems  Respiratory: Negative.   Cardiovascular: Negative.   Gastrointestinal: Negative.   Genitourinary: Negative.   All other systems reviewed and are negative.  Filed Vitals:   10/26/14 0923  BP: 114/84  Pulse: 90  Temp: 98.1 F (36.7 C)  Weight: 135 lb (61.236 kg)       Objective:   Physical Exam  Nursing note and vitals reviewed. Constitutional: She is oriented to person, place, and time. She appears well-developed and well-nourished. No distress.  Cardiovascular: Normal rate, regular rhythm and normal heart sounds.   No murmur heard. Pulmonary/Chest: Effort normal and breath sounds normal. No respiratory distress. She has no wheezes.  Abdominal: Soft. Bowel sounds are normal. She exhibits no distension and no mass. There is no tenderness.  Genitourinary: Uterus normal. Cervix exhibits discharge. Cervix exhibits no motion tenderness and no friability. Right adnexum displays no mass. Left adnexum displays no mass. Vaginal discharge found.  Neurological: She is alert and oriented to person, place, and time.      Assessment:     Nexplanon removal Contraception counseling  Gyn exam: PAP and vaginal discharge    Plan:     Check problem list.

## 2014-10-26 NOTE — Assessment & Plan Note (Signed)
Counseling done on OCP and other birth control. She opted for OCP. I prescribed ortho tri cyclen lo. S/E of medication discussed with patient. Instructed to use condom as back up for the next 1-2 wks. She verbalized understanding.

## 2014-10-26 NOTE — Assessment & Plan Note (Addendum)
Informed verbal and written consent obtained. Nexplanon insertion site prep and cleaned with betadine and alcohol swab. Marker was placed on the skin right at the tip of the nexplanon device. <1cm incision made at the tip of the device. Device was later milked out and pulled out with a forcep. Procedure done without complications. Very minimal bleeding which was stopped with pressure. Wound was closed with dermabond and dressed . Wound care instruction given as well as return precaution. Total procedure time more than 25 min.

## 2014-10-27 LAB — CYTOLOGY - PAP

## 2015-01-25 ENCOUNTER — Telehealth: Payer: Self-pay | Admitting: Family Medicine

## 2015-01-25 NOTE — Telephone Encounter (Signed)
I spoke with patient regarding her birth control. She just got off Nexplanon and now on OCP. With this switch she is having period twice monthly. As discussed with her, this could be part of withdrawal bleed from progesterone/ Nexplanon removal. There is no guarantee that bleeding will improve if I switch to another OCP. I recommended if bleeding is not heavy might try to monitor for improvement in the next month' by then her system should have adjusted to the new med. Otherwise I will go ahead and switch her birth control. She does not want any regimen that will make her not have monthly period altogether. For now she decided to stay on current medication for another 1 month.

## 2015-01-25 NOTE — Telephone Encounter (Signed)
Pt called and would like the doctor to change her BC to another medication. This one is making her have two periods a month. jw

## 2015-05-22 ENCOUNTER — Encounter: Payer: Self-pay | Admitting: Family Medicine

## 2015-05-22 ENCOUNTER — Ambulatory Visit (INDEPENDENT_AMBULATORY_CARE_PROVIDER_SITE_OTHER): Payer: Medicaid Other | Admitting: Family Medicine

## 2015-05-22 VITALS — BP 112/76 | HR 75 | Ht 62.0 in | Wt 130.0 lb

## 2015-05-22 DIAGNOSIS — Z30013 Encounter for initial prescription of injectable contraceptive: Secondary | ICD-10-CM | POA: Diagnosis present

## 2015-05-22 LAB — POCT URINE PREGNANCY: Preg Test, Ur: NEGATIVE

## 2015-05-22 MED ORDER — MEDROXYPROGESTERONE ACETATE 150 MG/ML IM SUSP
150.0000 mg | Freq: Once | INTRAMUSCULAR | Status: AC
  Administered 2015-05-22: 150 mg via INTRAMUSCULAR

## 2015-05-22 NOTE — Assessment & Plan Note (Signed)
Prefers depo. Pregnancy test negative today. Counseling done on Depo. Shot given today. She is advised to use back up protection for 1-2 weeks. RTC in 3 months for Depo shot.

## 2015-05-22 NOTE — Progress Notes (Signed)
Subjective:     Patient ID: Jasmine Henry, female   DOB: 04-16-1981, 34 y.o.   MRN: [id number]  HPI  Contraception: Forgets to use her OCP, she is concern about getting pregnant on OCP due to missed dose, she will like Depo. She recently removed Nexplanon for weight gain. She had been on Depo in the past with no major weight gain, she will love to get back on this.   Current Outpatient Prescriptions on File Prior to Visit  Medication Sig Dispense Refill  . Norgestimate-Ethinyl Estradiol Triphasic (ORTHO TRI-CYCLEN LO) 0.18/0.215/0.25 MG-25 MCG tab Take 1 tablet by mouth daily. 1 Package 11   No current facility-administered medications on file prior to visit.   Past Medical History  Diagnosis Date  . No pertinent past medical history       Review of Systems  Constitutional: Negative for fever.  Respiratory: Negative.   Cardiovascular: Negative.   Gastrointestinal: Negative.   Genitourinary: Negative.   All other systems reviewed and are negative.  Filed Vitals:   05/22/15 0933  BP: 112/76  Pulse: 75  Height: 5\' 2"  (1.575 m)  Weight: 130 lb (58.968 kg)       Objective:   Physical Exam  Constitutional: She is oriented to person, place, and time. She appears well-developed. No distress.  Cardiovascular: Normal rate, regular rhythm, normal heart sounds and intact distal pulses.   No murmur heard. Pulmonary/Chest: Effort normal and breath sounds normal. No respiratory distress. She has no wheezes. She exhibits no tenderness.  Abdominal: Soft. Bowel sounds are normal. She exhibits no distension and no mass. There is no tenderness.  Musculoskeletal: Normal range of motion. She exhibits no edema.  Neurological: She is alert and oriented to person, place, and time.  Nursing note and vitals reviewed.      Assessment:     Contraception     Plan:     Check problem list.

## 2015-05-22 NOTE — Patient Instructions (Signed)

## 2015-07-24 ENCOUNTER — Ambulatory Visit (INDEPENDENT_AMBULATORY_CARE_PROVIDER_SITE_OTHER): Payer: Medicaid Other | Admitting: Family Medicine

## 2015-07-24 ENCOUNTER — Encounter: Payer: Self-pay | Admitting: Family Medicine

## 2015-07-24 VITALS — BP 109/72 | HR 87 | Temp 98.6°F | Ht 63.0 in | Wt 131.5 lb

## 2015-07-24 DIAGNOSIS — S0081XA Abrasion of other part of head, initial encounter: Secondary | ICD-10-CM | POA: Diagnosis not present

## 2015-07-24 NOTE — Patient Instructions (Signed)

## 2015-07-24 NOTE — Progress Notes (Signed)
Subjective: Jasmine Henry is a  healthy 34 y.o. female presenting for face scrape.  Jasmine Henry unfortunately tripped and fell onto concrete 2 days ago. She landed on the right side of her face causing scrapes on her forehead and cheek. The wounds were cleaned immediately. The skin is tender but she denies facial pain, pain moving her eye or difficulty moving her eye. She denies fevers, chills, vomiting, AMS, discharge, or other wounds. She has been putting neosporin on the areas and leaving them uncovered. She wants to know how to minimize scarring.  - TdaP administered 08/06/2011 - Non-smoker - Meds: OCP - Allergies: NKA  Objective: BP 109/72 mmHg  Pulse 87  Temp(Src) 98.6 F (37 C) (Oral)  Ht  (1.6 m)  Wt 131 lb 8 oz (59.648 kg)  BMI 23.30 kg/m2 Gen: Well-appearing 34 y.o. female in no distress Skin: Superficial abrasions with normal granulation tissue on forehead (~3.5 cm x 3 cm) and right cheek overlying zygomatic (~3cm x 3cm) with ointment but no discharge, surrounding erythema.  HEENT: EOMI, Eyes normal.  Assessment/Plan: Jasmine Henry is a 35 y.o. female here for facial abrasions.  See problem list for plan.

## 2015-07-24 NOTE — Assessment & Plan Note (Signed)
Without infection. Not contaminated. Immunocompetent, healthy patient. Appears to involve dermis in areas and thus may have some scar formation but counseled to use bacitracin and keep covered and clean. Supplies given. Return precautions reviewed.

## 2016-06-30 ENCOUNTER — Ambulatory Visit: Payer: Medicaid Other | Admitting: Internal Medicine

## 2017-08-26 ENCOUNTER — Ambulatory Visit (INDEPENDENT_AMBULATORY_CARE_PROVIDER_SITE_OTHER): Payer: Self-pay | Admitting: Internal Medicine

## 2017-08-26 ENCOUNTER — Encounter: Payer: Self-pay | Admitting: Internal Medicine

## 2017-08-26 VITALS — BP 130/78 | HR 86 | Temp 98.9°F | Wt 127.0 lb

## 2017-08-26 DIAGNOSIS — R05 Cough: Secondary | ICD-10-CM

## 2017-08-26 DIAGNOSIS — R059 Cough, unspecified: Secondary | ICD-10-CM

## 2017-08-26 MED ORDER — AZITHROMYCIN 250 MG PO TABS: ORAL_TABLET | ORAL | 0 refills | Status: DC

## 2017-08-26 MED ORDER — HYDROCODONE-HOMATROPINE 5-1.5 MG/5ML PO SYRP: 5.0000 mL | ORAL_SOLUTION | Freq: Three times a day (TID) | ORAL | 0 refills | Status: DC | PRN

## 2017-08-26 NOTE — Patient Instructions (Signed)
I am treating you for Pneumonia with an antibiotic called Azithromycin. I have also prescribed cough syrup. Do not take this while driving or working. You can continue with Mucinex and Dayquil as needed.   Please get a chest x-ray to make sure nothing else is going on.   Thank you,  Dr. Earlene PlaterWallace

## 2017-08-26 NOTE — Progress Notes (Signed)
   Subjective:    Bryson CoronaHom Adamcik - 36 y.o. female MRN [id number]  Date of birth: [birth year]  HPI  Martita Kellie ShropshireKpor is here for SDA for cough.  COUGH  Has been coughing for 4 weeks. Cough is: moderate Sputum production: occasional thick mucus production  Medications tried: Mucinex and Dayquil  Taking blood pressure medications: none  Symptoms Runny nose: yes Mucous in back of throat: yes  Throat burning or reflux: no Wheezing or asthma: no Fever: no Chest Pain: only present with coughing  Shortness of breath: no Leg swelling: no Hemoptysis: no Weight loss: no  ROS see HPI Smoking Status noted   -  reports that she has never smoked. She has never used smokeless tobacco. - Review of Systems: Per HPI. - Past Medical History: Patient Active Problem List   Diagnosis Date Noted  . Abrasion of cheek 07/24/2015  . Screening for malignant neoplasm of cervix 10/26/2014  . Vaginal discharge 10/26/2014  . Contact dermatitis 06/29/2014  . Encounter for contraceptive management 03/14/2014  . External hemorrhoids 06/14/2013  . 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 Family history of cancer of small intestine 09/07/2012   - Medications: reviewed and updated   Objective:   Physical Exam BP 130/78   Temp 98.9 F (37.2 C) (Oral)   Wt 127 lb (57.6 kg)   LMP 07/22/2017   BMI 22.50 kg/m  Gen: NAD, alert, cooperative with exam, well-appearing HEENT: NCAT, PERRL, TMs normal bilaterally, no facial or maxillary sinus TTP, oropharynx erythematous but without exudates or tonsillar hypertrophy  CV: RRR, good S1/S2, no murmur, no edema, negative Homan's sign bilaterally  Resp: diminished lung sounds in RLL otherwise clear to auscultation with good air movement   Assessment & Plan:   1. Cough Will treat for presumptive CAP with Azithromycin given persistent cough and lung exam findings of diminished breath sounds in one field. Potentially a viral bronchitis that is causing persistent cough but will  precede with antibiotic therapy as potential benefits outweigh risks. Doubt PE given lack of tachycardia, lack of hypoxia, no signs of DVT, and no acute chest pain or hemoptysis. Will obtain CXR to evaluate for pneumothorax given diminished breath sounds.  - DG Chest 2 View; Future - azithromycin (ZITHROMAX) 250 MG tablet; Take two tablets on day 1, then one tablet daily x4 days.  Dispense: 6 each; Refill: 0 - HYDROcodone-homatropine (HYCODAN) 5-1.5 MG/5ML syrup; Take 5 mLs by mouth every 8 (eight) hours as needed for cough.  Dispense: 120 mL; Refill: 0 -continue Mucinex and Dayquil prn  -return precautions such as acute chest pain, hemoptysis, SOB discussed  -follow up if cough not improving over next 2 weeks   Marcy Sirenatherine Wallace, D.O. 08/26/2017, 12:03 PM PGY-3, [facility]  Precepted patient with Dr. Pollie MeyerMcIntyre.

## 2020-01-17 ENCOUNTER — Ambulatory Visit: Payer: Medicaid Other | Attending: Internal Medicine

## 2020-01-17 DIAGNOSIS — Z20822 Contact with and (suspected) exposure to covid-19: Secondary | ICD-10-CM

## 2020-01-18 LAB — NOVEL CORONAVIRUS, NAA: SARS-CoV-2, NAA: NOT DETECTED

## 2020-04-10 ENCOUNTER — Emergency Department (HOSPITAL_COMMUNITY)
Admission: EM | Admit: 2020-04-10 | Discharge: 2020-04-11 | Disposition: A | Discharge status: Home / Self Care | Payer: Medicaid Other | Attending: Emergency Medicine | Admitting: Emergency Medicine

## 2020-04-10 ENCOUNTER — Other Ambulatory Visit: Payer: Self-pay

## 2020-04-10 ENCOUNTER — Encounter (HOSPITAL_COMMUNITY): Payer: Self-pay | Admitting: *Deleted

## 2020-04-10 DIAGNOSIS — F321 Major depressive disorder, single episode, moderate: Secondary | ICD-10-CM | POA: Insufficient documentation

## 2020-04-10 DIAGNOSIS — R4587 Impulsiveness: Secondary | ICD-10-CM | POA: Insufficient documentation

## 2020-04-10 DIAGNOSIS — F10929 Alcohol use, unspecified with intoxication, unspecified: Secondary | ICD-10-CM | POA: Insufficient documentation

## 2020-04-10 DIAGNOSIS — Z20822 Contact with and (suspected) exposure to covid-19: Secondary | ICD-10-CM | POA: Insufficient documentation

## 2020-04-10 DIAGNOSIS — T50902A Poisoning by unspecified drugs, medicaments and biological substances, intentional self-harm, initial encounter: Secondary | ICD-10-CM

## 2020-04-10 DIAGNOSIS — Z79899 Other long term (current) drug therapy: Secondary | ICD-10-CM | POA: Insufficient documentation

## 2020-04-10 DIAGNOSIS — R45851 Suicidal ideations: Secondary | ICD-10-CM | POA: Insufficient documentation

## 2020-04-10 LAB — COMPREHENSIVE METABOLIC PANEL
ALT: 23 U/L (ref 0–44)
AST: 22 U/L (ref 15–41)
Albumin: 4 g/dL (ref 3.5–5.0)
Alkaline Phosphatase: 34 U/L — ABNORMAL LOW (ref 38–126)
Anion gap: 12 (ref 5–15)
BUN: 10 mg/dL (ref 6–20)
CO2: 24 mmol/L (ref 22–32)
Calcium: 8.3 mg/dL — ABNORMAL LOW (ref 8.9–10.3)
Chloride: 107 mmol/L (ref 98–111)
Creatinine, Ser: 0.48 mg/dL (ref 0.44–1.00)
GFR calc Af Amer: >60 mL/min (ref >60)
GFR calc non Af Amer: >60 mL/min (ref >60)
Glucose, Bld: 91 mg/dL (ref 70–99)
Potassium: 3.8 mmol/L (ref 3.5–5.1)
Sodium: 143 mmol/L (ref 135–145)
Total Bilirubin: 0.7 mg/dL (ref 0.3–1.2)
Total Protein: 7.7 g/dL (ref 6.5–8.1)

## 2020-04-10 LAB — RAPID URINE DRUG SCREEN, HOSP PERFORMED
Amphetamines: NOT DETECTED
Barbiturates: NOT DETECTED
Benzodiazepines: NOT DETECTED
Cocaine: NOT DETECTED
Opiates: NOT DETECTED
Tetrahydrocannabinol: NOT DETECTED

## 2020-04-10 LAB — CBC
HCT: 42 % (ref 36.0–46.0)
Hemoglobin: 12.9 g/dL (ref 12.0–15.0)
MCH: 26.2 pg (ref 26.0–34.0)
MCHC: 30.7 g/dL (ref 30.0–36.0)
MCV: 85.2 fL (ref 80.0–100.0)
Platelets: 366 10*3/uL (ref 150–400)
RBC: 4.93 MIL/uL (ref 3.87–5.11)
RDW: 14.2 % (ref 11.5–15.5)
WBC: 6.6 10*3/uL (ref 4.0–10.5)
nRBC: 0 % (ref 0.0–0.2)

## 2020-04-10 LAB — I-STAT BETA HCG BLOOD, ED (MC, WL, AP ONLY): I-stat hCG, quantitative: <5 m[IU]/mL (ref <5)

## 2020-04-10 NOTE — Discharge Instructions (Addendum)
As discussed with our behavioral health colleagues it is very important that you follow-up with your outpatient clinical team.  If you have any concerning thoughts, or changes in your condition, return here immediately.

## 2020-04-10 NOTE — ED Triage Notes (Signed)
Pt arrives via [facility], she took 5 of her melatonin about 45 minutes and has been drinking alcohol (unknown amount) in attempts to harm herself. She c/a/o for EMS. 118/80, 92hr, 18 resp, 89 cbg .

## 2020-04-10 NOTE — ED Provider Notes (Signed)
[facility]-EMERGENCY DEPT Provider Note   CSN: [id number] Arrival date & time: 04/10/20  2208     History Chief Complaint  Patient presents with  . Suicidal    Jasmine Henry is a 39 y.o. female.   39 year old female presents to the emergency department for psychiatric evaluation.  She reports taking 5 tablets of over-the-counter melatonin 45 minutes prior to arrival because her kids make her feel "sad".  She does admit to ingestion with intent for self-harm, but denies any suicidal ideations at this time.  Denies any prior history of overdose or psychiatric assessment or hospitalization.  She has been drinking alcohol tonight as well.  Denies illicit drug use.  No homicidal ideations.  Denies pain at this time.  No additional complaints.  The history is provided by the patient. No language interpreter was used.       Past Medical History:  Diagnosis Date  . No pertinent past medical history     Patient Active Problem List   Diagnosis Date Noted  . Abrasion of cheek 07/24/2015  . Screening for malignant neoplasm of cervix 10/26/2014  . Vaginal discharge 10/26/2014  . Contact dermatitis 06/29/2014  . Encounter for contraceptive management 03/14/2014  . External hemorrhoids 06/14/2013  . 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 Family history of cancer of small intestine 09/07/2012    Past Surgical History:  Procedure Laterality Date  . NO PAST SURGERIES       OB History    Gravida  4   Para  4   Term  2   Preterm      AB      Living  4     SAB      TAB      Ectopic      Multiple      Live Births  4           Family History  Problem Relation Age of Onset  . Brain cancer Father   . Asthma Father   . Colon cancer Sister        liver and pancreatic ca    Social History   Tobacco Use  . Smoking status: Never Smoker  . Smokeless tobacco: Never Used  Substance Use Topics  . Alcohol use: No  . Drug use: No    Home Medications  Prior to Admission medications   Medication Sig Start Date End Date Taking? Authorizing Provider  MELATONIN PO Take 1 tablet by mouth at bedtime as needed (sleep).   Yes [provider]  azithromycin (ZITHROMAX) 250 MG tablet Take two tablets on day 1, then one tablet daily x4 days. Patient not taking: Reported on 04/10/2020 08/26/17   Arvilla Market, DO  HYDROcodone-homatropine [facility]) 5-1.5 MG/5ML syrup Take 5 mLs by mouth every 8 (eight) hours as needed for cough. Patient not taking: Reported on 04/10/2020 08/26/17   Arvilla Market, DO  Norgestimate-Ethinyl Estradiol Triphasic (ORTHO TRI-CYCLEN LO) 0.18/0.215/0.25 MG-25 MCG tab Take 1 tablet by mouth daily. Patient not taking: Reported on 04/10/2020 10/26/14   Doreene Eland, MD    Allergies    Patient has no known allergies.  Review of Systems   Review of Systems  Ten systems reviewed and are negative for acute change, except as noted in the HPI.    Physical Exam Updated Vital Signs BP 113/86 (BP Location: Left Arm)   Pulse 93   Temp 99 F (37.2 C) (Oral)   Resp 17  SpO2 99%   Physical Exam Vitals and nursing note reviewed.  Constitutional:      General: She is not in acute distress.    Appearance: She is well-developed. She is not diaphoretic.     Comments: Nontoxic appearing and in NAD  HENT:     Head: Normocephalic and atraumatic.  Eyes:     General: No scleral icterus.    Conjunctiva/sclera: Conjunctivae normal.  Cardiovascular:     Rate and Rhythm: Normal rate and regular rhythm.     Pulses: Normal pulses.  Pulmonary:     Effort: Pulmonary effort is normal. No respiratory distress.     Comments: Respirations even and unlabored Musculoskeletal:        General: Normal range of motion.     Cervical back: Normal range of motion.  Skin:    General: Skin is warm and dry.     Coloration: Skin is not pale.     Findings: No erythema or rash.  Neurological:     General: No focal  deficit present.     Mental Status: She is alert and oriented to person, place, and time.     Coordination: Coordination normal.  Psychiatric:        Mood and Affect: Mood is depressed.        Speech: Speech normal.        Behavior: Behavior is withdrawn.     Comments: Makes poor eye contact. Denies SI at this time.     ED Results / Procedures / Treatments   Labs (all labs ordered are listed, but only abnormal results are displayed) Labs Reviewed  COMPREHENSIVE METABOLIC PANEL - Abnormal; Notable for the following components:      Result Value   Calcium 8.3 (*)    Alkaline Phosphatase 34 (*)    All other components within normal limits  ETHANOL - Abnormal; Notable for the following components:   Alcohol, Ethyl (B) 168 (*)    All other components within normal limits  SALICYLATE LEVEL - Abnormal; Notable for the following components:   Salicylate Lvl <7.0 (*)    All other components within normal limits  ACETAMINOPHEN LEVEL - Abnormal; Notable for the following components:   Acetaminophen (Tylenol), Serum <10 (*)    All other components within normal limits  CBC  RAPID URINE DRUG SCREEN, HOSP PERFORMED  I-STAT BETA HCG BLOOD, ED ([facility], [facility], [facility] ONLY)    EKG None  Radiology No results found.  Procedures Procedures (including critical care time)  Medications Ordered in ED Medications - No data to display  ED Course  I have reviewed the triage vital signs and the nursing notes.  Pertinent labs & imaging results that were available during my care of the patient were reviewed by me and considered in my medical decision making (see chart for details).    MDM Rules/Calculators/A&P                      39 year old female presenting to the emergency department for psychiatric evaluation.  Took 5 tablets of over-the-counter melatonin with intent of self-harm, though patient now reports that she is not suicidal.  She has no history of suicide attempt or psychiatric  hospitalization.  Has never been followed by a psychiatrist.  She has been observed in the ED and medically cleared.  TTS recommending psychiatric evaluation in the morning.  Disposition to be determined by oncoming ED provider.   Final Clinical Impression(s) / ED Diagnoses Final diagnoses:  Overdose, intentional self-harm, initial encounter ([place])  Impulsiveness  Alcoholic intoxication with complication [facility])    Rx / DC Orders ED Discharge Orders    None       Antonietta Breach, PA-C 04/11/20 0424    Tegeler, Gwenyth Allegra, MD 04/13/20 1630

## 2020-04-10 NOTE — ED Triage Notes (Signed)
Pt says that she was "overthinking" and took the melatonin tonight. She says that she "regrets the decision" and does not currently feel suicidal. She is oriented, answering questions appropriately at this time.

## 2020-04-11 LAB — SALICYLATE LEVEL: Salicylate Lvl: <7 mg/dL — ABNORMAL LOW (ref 7.0–30.0)

## 2020-04-11 LAB — ACETAMINOPHEN LEVEL: Acetaminophen (Tylenol), Serum: <10 ug/mL — ABNORMAL LOW (ref 10–30)

## 2020-04-11 LAB — RESPIRATORY PANEL BY RT PCR (FLU A&B, COVID)
Influenza A by PCR: NEGATIVE
Influenza B by PCR: NEGATIVE
SARS Coronavirus 2 by RT PCR: NEGATIVE

## 2020-04-11 LAB — ETHANOL: Alcohol, Ethyl (B): 168 mg/dL — ABNORMAL HIGH (ref <10)

## 2020-04-11 NOTE — ED Notes (Signed)
Pt belongs placed in nursing station behind triage. Pt jewelry contains one bracelet 5 rings, cell phone charger, and cell phone. Pt has on 2 bracelets that can not come off so patient still has on a gold and a tan bracelet

## 2020-04-11 NOTE — ED Provider Notes (Signed)
Patient contracted for safety with behavioral health.   Gerhard Munch, MD 04/11/20 [phone]

## 2020-04-11 NOTE — BH Assessment (Signed)
[facility] Assessment Progress Note    Patient was seen as requested by Charge Nurse.  Patient is requesting to leave and states that she is able to contract for safety.  She states that she was drinking last night and is not sure why she took the Melatonin, but states that it was not because she was suicidal.  Patient states that she had been drinking last night and she states that it was an impulsive decision.  Patient states that she has never done anything like this before and states that she is remorseful that she took the pills.  Patient states that she is willing to stay with her mother or sister if needed and that they will monitor her and that she would return to ED if she started feeling suicidal.  [facility] provider not currently available to see patient, so the decision to discharge patient would be up to the EDP.  If EDP is not comfortable to discharge patient, [facility] provider will be available to see patient after morning bed meeting.

## 2020-04-11 NOTE — BH Assessment (Signed)
Tele Assessment Note   Patient Name: Jasmine Henry MRN: [id number] Referring Physician: Antonietta Breach, PA Location of Patient: [facility] Location of Provider: [place] is an 39 y.o. female.  -Clinician reviewed note by Antonietta Breach, PA.  Pt is a 38 year old female presents to the emergency department for psychiatric evaluation.  She reports taking 5 tablets of over-the-counter melatonin 45 minutes prior to arrival because her kids make her feel "sad".  She does admit to ingestion with intent for self-harm, but denies any suicidal ideations at this time.  Denies any prior history of overdose or psychiatric assessment or hospitalization.  She has been drinking alcohol tonight as well.  Denies illicit drug use.  No homicidal ideations.  Patient had a BAL of 168 at 22:48.  She said she had about 6 shots of liquor tonight.  Pt says she does not usually drink.  Patient admits to taking 5 tablets of OTC melatonin.  When asked why she said, "I really don't know why I did it."  She goes on to say she regrets doing it.  Patient   denies any current SI.  She also denies any past suicide attempts.  Patient denies any HI or A/V hallucinations.  She denies the use of other substances.  A couple of times during the assessment she gets up to attempt to throw up.  Patient said that her 38 year old daughter called EMS for her.  Patient says that she feels unappreciated by her children last night.  Patient says that her 4 year old daughter had been picked up by her father and the child did not answer mother when she asked when she would be returning.  Patient says that this hurt her feelings.  She had this feeling of being unappreciated after this incident and had taken the pills after this.    Patient has a flat affect.  She is not responding to internal stimuli.  Pt thought process is logical and coherent.  Patient is oriented x4.  Patient affect is congruent with statements about depression.   Pt reports normal appetite and sleep.  Patient denies any previous outpatient or inpatient psychiatric care.  Patient gave permission to call older daughter but there was no answer when clinician attempted to call her.  -Clinician discussed patient care with Talbot Grumbling, NP She recommends pt be observed and have AM psych evaluation.  Clinician did talk to Antonietta Breach, PA and let her know of disposition.  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 No pertinent past medical history     Past Surgical History:  Procedure Laterality Date  . NO PAST SURGERIES      Family History:  Family History  Problem Relation Age of Onset  . Brain cancer Father   . Asthma Father   . Colon cancer Sister        liver and pancreatic ca    Social History:  reports that she has never smoked. She has never used smokeless tobacco. She reports that she does not drink alcohol or use drugs.  Additional Social History:  Alcohol / Drug Use Pain Medications: None Prescriptions: None Over the Counter: None History of alcohol / drug use?: Yes Substance #1 Name of Substance 1: ETOH 1 - Age of First Use: 39 years of age 2 - Amount (size/oz): Varies 1 - Frequency: Special occasions 1 - Duration: off and  on 1 - Last Use / Amount: 04/13  CIWA: CIWA-Ar BP: 113/86 Pulse Rate: 93 COWS:    Allergies: No Known Allergies  Home Medications: (Not in a hospital admission)   OB/GYN Status:  No LMP recorded. Patient has had an injection.  General Assessment Data Location of Assessment: [facility] TTS Assessment: In system Is this a Tele or Face-to-Face Assessment?: Tele Assessment Is this an Initial Assessment or a Re-assessment for this encounter?: Initial Assessment Patient Accompanied by:: N/A Language Other than English: No Living Arrangements: Other (Comment)(Lives with mother and four children.) What gender do you  identify as?: Female Marital status: Single Pregnancy Status: No Living Arrangements: Parent Can pt return to current living arrangement?: Yes Admission Status: Voluntary Is patient capable of signing voluntary admission?: Yes Referral Source: Self/Family/Friend(EMS brought pt to [facility] after daughter called EMS.) Insurance type: self pay     Crisis Care Plan Living Arrangements: Parent Name of Psychiatrist: None Name of Therapist: None  Education Status Is patient currently in school?: No Current Grade: N/A Highest grade of school patient has completed: N/A Name of school: N/A Contact person: N/A IEP information if applicable: N/A Is the patient employed, unemployed or receiving disability?: Employed  Risk to self with the past 6 months Suicidal Ideation: No(Pt not sure of what she wanted to do.) Has patient been a risk to self within the past 6 months prior to admission? : No Suicidal Intent: No(Pt not sure what her intention was regarding why she took.) Has patient had any suicidal intent within the past 6 months prior to admission? : No Is patient at risk for suicide?: No Suicidal Plan?: No(Pt denies that she intended to kill self.  Not sure what she) Has patient had any suicidal plan within the past 6 months prior to admission? : No Access to Means: Yes Specify Access to Suicidal Means: OTC meds What has been your use of drugs/alcohol within the last 12 months?: ETOH occasionally Previous Attempts/Gestures: No How many times?: 0 Other Self Harm Risks: None Triggers for Past Attempts: None known Intentional Self Injurious Behavior: None Family Suicide History: No Recent stressful life event(s): Conflict (Comment)(Children being disrespectful) Persecutory voices/beliefs?: Yes Depression: Yes Depression Symptoms: Despondent, Loss of interest in usual pleasures, Feeling worthless/self pity Substance abuse history and/or treatment for substance abuse?: No Suicide  prevention information given to non-admitted patients: Not applicable  Risk to Others within the past 6 months Homicidal Ideation: No Does patient have any lifetime risk of violence toward others beyond the six months prior to admission? : No Thoughts of Harm to Others: No Current Homicidal Intent: No Current Homicidal Plan: No Access to Homicidal Means: No Identified Victim: No one History of harm to others?: No Assessment of Violence: None Noted Violent Behavior Description: 0 Does patient have access to weapons?: No Criminal Charges Pending?: No Does patient have a court date: No Is patient on probation?: No  Psychosis Hallucinations: None noted Delusions: None noted  Mental Status Report Appearance/Hygiene: Unremarkable Eye Contact: Poor Motor Activity: Freedom of movement, Unremarkable Speech: Logical/coherent Level of Consciousness: Alert Mood: Depressed, Helpless, Sad Affect: Depressed Anxiety Level: Moderate Thought Processes: Coherent, Relevant Judgement: Impaired Orientation: Person, Situation, Place, Time Obsessive Compulsive Thoughts/Behaviors: None  Cognitive Functioning Concentration: Normal Memory: Recent Intact, Remote Intact Is patient IDD: No Insight: Fair Impulse Control: Poor Appetite: Good Have you had any weight changes? : No Change Sleep: No Change Total Hours of Sleep: (Denies problems) Vegetative Symptoms: None  ADLScreening [facility] Assessment Services)  Patient's cognitive ability adequate to safely complete daily activities?: Yes Patient able to express need for assistance with ADLs?: Yes Independently performs ADLs?: Yes (appropriate for developmental age)  Prior Inpatient Therapy Prior Inpatient Therapy: No  Prior Outpatient Therapy Prior Outpatient Therapy: No Does patient have an ACCT team?: No Does patient have Intensive In-House Services?  : No Does patient have [facility] services? : No Does patient have P4CC services?: No  ADL  Screening (condition at time of admission) Patient's cognitive ability adequate to safely complete daily activities?: Yes Is the patient deaf or have difficulty hearing?: No Does the patient have difficulty seeing, even when wearing glasses/contacts?: Yes(Uses glasses for seeing far away.) Does the patient have difficulty concentrating, remembering, or making decisions?: No Patient able to express need for assistance with ADLs?: Yes Does the patient have difficulty dressing or bathing?: No Independently performs ADLs?: Yes (appropriate for developmental age) Does the patient have difficulty walking or climbing stairs?: No Weakness of Legs: None Weakness of Arms/Hands: None       Abuse/Neglect Assessment (Assessment to be complete while patient is alone) Abuse/Neglect Assessment Can Be Completed: Yes Physical Abuse: Denies Verbal Abuse: Denies Sexual Abuse: Denies Exploitation of patient/patient's resources: Denies Self-Neglect: Denies     Merchant navy officer (For Healthcare) Does Patient Have a Medical Advance Directive?: No Would patient like information on creating a medical advance directive?: No - Patient declined          Disposition:  Disposition Initial Assessment Completed for this Encounter: Yes Patient referred to: Other (Comment)(AM psych evaluation)  This service was provided via telemedicine using a 2-way, interactive audio and video technology.  Names of all persons participating in this telemedicine service and their role in this encounter. Name: Jasmine Henry Role: patient  Name: Beatriz Stallion, M.S. LCAS QP Role: clinician  Name:  Role:   Name:  Role:     [place] 04/11/2020 4:20 AM

## 2020-06-25 ENCOUNTER — Encounter: Payer: Self-pay | Admitting: Family Medicine

## 2020-06-26 ENCOUNTER — Encounter: Payer: Self-pay | Admitting: Family Medicine

## 2020-06-26 ENCOUNTER — Other Ambulatory Visit: Payer: Self-pay

## 2020-06-26 ENCOUNTER — Other Ambulatory Visit (HOSPITAL_COMMUNITY)
Admission: RE | Admit: 2020-06-26 | Discharge: 2020-06-26 | Disposition: A | Discharge status: Home / Self Care | Payer: Medicaid Other | Source: Ambulatory Visit | Attending: Family Medicine | Admitting: Family Medicine

## 2020-06-26 ENCOUNTER — Ambulatory Visit (INDEPENDENT_AMBULATORY_CARE_PROVIDER_SITE_OTHER): Payer: Medicaid Other | Admitting: Family Medicine

## 2020-06-26 VITALS — BP 122/84 | Ht 63.0 in | Wt 130.0 lb

## 2020-06-26 DIAGNOSIS — Z1159 Encounter for screening for other viral diseases: Secondary | ICD-10-CM | POA: Diagnosis not present

## 2020-06-26 DIAGNOSIS — R5383 Other fatigue: Secondary | ICD-10-CM

## 2020-06-26 DIAGNOSIS — Z124 Encounter for screening for malignant neoplasm of cervix: Secondary | ICD-10-CM | POA: Insufficient documentation

## 2020-06-26 DIAGNOSIS — N898 Other specified noninflammatory disorders of vagina: Secondary | ICD-10-CM | POA: Insufficient documentation

## 2020-06-26 DIAGNOSIS — G4700 Insomnia, unspecified: Secondary | ICD-10-CM

## 2020-06-26 DIAGNOSIS — K5900 Constipation, unspecified: Secondary | ICD-10-CM

## 2020-06-26 DIAGNOSIS — Z0000 Encounter for general adult medical examination without abnormal findings: Secondary | ICD-10-CM

## 2020-06-26 MED ORDER — HYDROXYZINE HCL 50 MG PO TABS: 50.0000 mg | ORAL_TABLET | Freq: Every day | ORAL | 1 refills | Status: DC

## 2020-06-26 NOTE — Progress Notes (Signed)
Subjective:     Jasmine Henry is a 39 y.o. female and is here for a comprehensive physical exam. The patient reports problems - Constipation x 6 - 12 months. No blood in her stool. She will like to check anemia panel as she feels cold and tired sometimes. Insomnia x 4 months. Try to sleep from 10 PM and will not sleep till 12 AM or 1 AM. She does not feel rested in the morning. She tried high dose melatonin in the past with no improvement.  LMP: 3 weeks ago. Regular. Sexually active. Uses condoms regularly. Does not want to be on birth control.  Social History   Socioeconomic History  . Marital status: Single    Spouse name: Not on file  . Number of children: 4  . Years of education: Not on file  . Highest education level: Not on file  Occupational History  . Occupation: Research officer, trade union: [employer]  Tobacco Use  . Smoking status: Never Smoker  . Smokeless tobacco: Never Used  Substance and Sexual Activity  . Alcohol use: No  . Drug use: No  . Sexual activity: Yes    Birth control/protection: Pill  Other Topics Concern  . Not on file  Social History Narrative  . Not on file   Social Determinants of Health   Financial Resource Strain:   . Difficulty of Paying Living Expenses:   Food Insecurity:   . Worried About Programme researcher, broadcasting/film/video in the Last Year:   . Barista in the Last Year:   Transportation Needs:   . Freight forwarder (Medical):   Marland Kitchen Lack of Transportation (Non-Medical):   Physical Activity:   . Days of Exercise per Week:   . Minutes of Exercise per Session:   Stress:   . Feeling of Stress :   Social Connections:   . Frequency of Communication with Friends and Family:   . Frequency of Social Gatherings with Friends and Family:   . Attends Religious Services:   . Active Member of Clubs or Organizations:   . Attends Banker Meetings:   Marland Kitchen Marital Status:   Intimate Partner Violence:   . Fear of Current or Ex-Partner:   .  Emotionally Abused:   Marland Kitchen Physically Abused:   . Sexually Abused:    Health Maintenance  Topic Date Due  . Hepatitis C Screening  Never done  . COVID-19 Vaccine (1) Never done  . PAP SMEAR-Modifier  10/26/2017  . INFLUENZA VACCINE  07/29/2020  . TETANUS/TDAP  08/05/2021  . HIV Screening  Completed    The following portions of the patient's history were reviewed and updated as appropriate: allergies, current medications, past family history, past medical history, past social history, past surgical history and problem list.  Review of Systems Pertinent items noted in HPI and remainder of comprehensive ROS otherwise negative.   Objective:    BP 122/84   Ht 5\' 3"  (1.6 m)   Wt 130 lb (59 kg)   LMP 06/05/2020 (Approximate)   BMI 23.03 kg/m  General appearance: alert and cooperative Head: Normocephalic, without obvious abnormality, atraumatic Eyes: conjunctivae/corneas clear. PERRL, EOM's intact. Fundi benign. Ears: normal TM's and external ear canals both ears Throat: lips, mucosa, and tongue normal; teeth and gums normal Lungs: clear to auscultation bilaterally Heart: regular rate and rhythm, S1, S2 normal, no murmur, click, rub or gallop Abdomen: soft, non-tender; bowel sounds normal; no masses,  no organomegaly Pelvic:  cervix normal in appearance, external genitalia normal, no adnexal masses or tenderness, no cervical motion tenderness, rectovaginal septum normal, uterus normal size, shape, and consistency and Mild discharge Skin: Skin color, texture, turgor normal. No rashes or lesions Neurologic: Alert and oriented X 3, normal strength and tone. Normal symmetric reflexes. Normal coordination and gait      Office Visit from 06/26/2020 in [facility]  PHQ-9 Total Score 3      Assessment:    Healthy female exam.      Plan:     PAP completed today. Wet prep, GC/Chlamydia also checked due to mild discharge on vaginal exam. I will contact her with the  result. Hep C screening completed today. Safe sex counseling done. OCP discussed. She does not want to be on hormonal birth control. She will contact our office should she change her mind. Hx of colon cancer in her sister who dies at 91. I offered referral to a genetic counselor but she declined. F/U as needed. Hydroxyzine prescribed prn insomnia. F/U soon if no improvement. I recommended Miralax plus Colace prn constipation. F/U soon if no improvement. She agreed with the plan.  See After Visit Summary for Counseling Recommendations

## 2020-06-26 NOTE — Patient Instructions (Addendum)
  It was nice seeing you today. We reseted your MyChart Password: [email or web address].  Please change your password once you get home by going to this website: [email or web address]

## 2020-06-27 LAB — ANEMIA PROFILE B
Basophils Absolute: 0 10*3/uL (ref 0.0–0.2)
Basos: 1 %
EOS (ABSOLUTE): 0.1 10*3/uL (ref 0.0–0.4)
Eos: 1 %
Ferritin: 87 ng/mL (ref 15–150)
Folate: >20 ng/mL (ref >3.0)
Hematocrit: 44.1 % (ref 34.0–46.6)
Hemoglobin: 13.6 g/dL (ref 11.1–15.9)
Immature Grans (Abs): 0 10*3/uL (ref 0.0–0.1)
Immature Granulocytes: 0 %
Iron Saturation: 17 % (ref 15–55)
Iron: 57 ug/dL (ref 27–159)
Lymphocytes Absolute: 2.1 10*3/uL (ref 0.7–3.1)
Lymphs: 33 %
MCH: 25.4 pg — ABNORMAL LOW (ref 26.6–33.0)
MCHC: 30.8 g/dL — ABNORMAL LOW (ref 31.5–35.7)
MCV: 82 fL (ref 79–97)
Monocytes Absolute: 0.7 10*3/uL (ref 0.1–0.9)
Monocytes: 10 %
Neutrophils Absolute: 3.6 10*3/uL (ref 1.4–7.0)
Neutrophils: 55 %
Platelets: 314 10*3/uL (ref 150–450)
RBC: 5.35 x10E6/uL — ABNORMAL HIGH (ref 3.77–5.28)
RDW: 14.2 % (ref 11.7–15.4)
Retic Ct Pct: 1.3 % (ref 0.6–2.6)
Total Iron Binding Capacity: 339 ug/dL (ref 250–450)
UIBC: 282 ug/dL (ref 131–425)
Vitamin B-12: 651 pg/mL (ref 232–1245)
WBC: 6.5 10*3/uL (ref 3.4–10.8)

## 2020-06-27 LAB — HEPATITIS C ANTIBODY: Hep C Virus Ab: <0.1 s/co ratio (ref 0.0–0.9)

## 2020-06-27 LAB — TSH: TSH: 2.66 u[IU]/mL (ref 0.450–4.500)

## 2020-06-29 ENCOUNTER — Telehealth: Payer: Self-pay | Admitting: Family Medicine

## 2020-06-29 DIAGNOSIS — R87613 High grade squamous intraepithelial lesion on cytologic smear of cervix (HGSIL): Secondary | ICD-10-CM

## 2020-06-29 LAB — CYTOLOGY - PAP
Chlamydia: NEGATIVE
Comment: NEGATIVE
Comment: NEGATIVE
Comment: NEGATIVE
Comment: NEGATIVE
Comment: NORMAL
HPV 16: NEGATIVE
HPV 18 / 45: NEGATIVE
High risk HPV: POSITIVE — AB
Neisseria Gonorrhea: NEGATIVE
Trichomonas: NEGATIVE

## 2020-06-29 NOTE — Telephone Encounter (Signed)
Dr. 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 At this point, I am torn between repeat test in a year vs. Referral to Gyn for second opinion. I will err on the side of caution and refer. I called and discussed options with the patient. She will like Gyn referral as well.

## 2020-07-20 ENCOUNTER — Other Ambulatory Visit: Payer: Self-pay | Admitting: Family Medicine

## 2020-07-20 DIAGNOSIS — R87613 High grade squamous intraepithelial lesion on cytologic smear of cervix (HGSIL): Secondary | ICD-10-CM

## 2020-07-23 NOTE — Addendum Note (Signed)
Addended by: Janit Pagan T on: 07/23/2020 10:15 AM   Modules accepted: Orders

## 2020-09-17 ENCOUNTER — Encounter: Payer: Self-pay | Admitting: Obstetrics and Gynecology

## 2020-09-17 ENCOUNTER — Encounter: Payer: Medicaid Other | Admitting: Obstetrics and Gynecology

## 2020-09-19 ENCOUNTER — Encounter: Payer: Self-pay | Admitting: Obstetrics and Gynecology

## 2020-09-19 DIAGNOSIS — N879 Dysplasia of cervix uteri, unspecified: Secondary | ICD-10-CM | POA: Insufficient documentation

## 2020-09-19 NOTE — Progress Notes (Signed)
Patient did not keep her GYN appointment for 09/17/2020.  In basket message sent to referring provider that per new ASCCP guidelines, recommendation is for one year rpt pap and hpv testing. Problem list updated.   Cornelia Copa MD Attending [facility] for [employer] [facility])

## 2020-09-27 NOTE — Progress Notes (Signed)
Patient ID: Jasmine Henry, female   DOB: 13-Oct-1981, 39 y.o.   MRN: [id number] Just to clarify, this patient also has some high grade lesion on her PAP report,hence the reason she was referred to [facility]. Dr. Vergie Living recommended f/u in 1 year.

## 2021-04-29 ENCOUNTER — Ambulatory Visit: Payer: Medicaid Other

## 2021-04-29 NOTE — Progress Notes (Deleted)
    SUBJECTIVE:   CHIEF COMPLAINT / HPI:   Dry/itchy throat/eyes: no meds  Pap?   PERTINENT  PMH / PSH: ***  OBJECTIVE:   There were no vitals taken for this visit.  ***  ASSESSMENT/PLAN:   No problem-specific Assessment & Plan notes found for this encounter.     Sandre Kitty, MD [facility]   {    This will disappear when note is signed, click to select method of visit    :1}

## 2022-01-08 ENCOUNTER — Encounter: Payer: Self-pay | Admitting: Family Medicine

## 2022-01-08 ENCOUNTER — Other Ambulatory Visit (HOSPITAL_COMMUNITY)
Admission: RE | Admit: 2022-01-08 | Discharge: 2022-01-08 | Disposition: A | Discharge status: Home / Self Care | Payer: Medicaid Other | Source: Ambulatory Visit | Attending: Family Medicine | Admitting: Family Medicine

## 2022-01-08 ENCOUNTER — Telehealth: Payer: Self-pay | Admitting: Family Medicine

## 2022-01-08 ENCOUNTER — Other Ambulatory Visit: Payer: Self-pay

## 2022-01-08 ENCOUNTER — Ambulatory Visit (INDEPENDENT_AMBULATORY_CARE_PROVIDER_SITE_OTHER): Payer: Medicaid Other | Admitting: Family Medicine

## 2022-01-08 ENCOUNTER — Ambulatory Visit (HOSPITAL_COMMUNITY)
Admission: RE | Admit: 2022-01-08 | Discharge: 2022-01-08 | Disposition: A | Discharge status: Home / Self Care | Payer: Medicaid Other | Source: Ambulatory Visit | Attending: Family Medicine | Admitting: Family Medicine

## 2022-01-08 VITALS — BP 110/80 | Ht 63.0 in | Wt 140.2 lb

## 2022-01-08 DIAGNOSIS — Z23 Encounter for immunization: Secondary | ICD-10-CM | POA: Diagnosis not present

## 2022-01-08 DIAGNOSIS — Z124 Encounter for screening for malignant neoplasm of cervix: Secondary | ICD-10-CM | POA: Insufficient documentation

## 2022-01-08 DIAGNOSIS — M545 Low back pain, unspecified: Secondary | ICD-10-CM | POA: Insufficient documentation

## 2022-01-08 DIAGNOSIS — Z309 Encounter for contraceptive management, unspecified: Secondary | ICD-10-CM | POA: Diagnosis not present

## 2022-01-08 DIAGNOSIS — T50902A Poisoning by unspecified drugs, medicaments and biological substances, intentional self-harm, initial encounter: Secondary | ICD-10-CM | POA: Insufficient documentation

## 2022-01-08 DIAGNOSIS — R21 Rash and other nonspecific skin eruption: Secondary | ICD-10-CM

## 2022-01-08 LAB — POCT URINE PREGNANCY: Preg Test, Ur: NEGATIVE

## 2022-01-08 MED ORDER — LO LOESTRIN FE 1 MG-10 MCG / 10 MCG PO TABS: 1.0000 | ORAL_TABLET | Freq: Every day | ORAL | 11 refills | Status: AC

## 2022-01-08 MED ORDER — TRIAMCINOLONE ACETONIDE 0.5 % EX OINT: 1.0000 "application " | TOPICAL_OINTMENT | Freq: Two times a day (BID) | CUTANEOUS | 0 refills | Status: AC

## 2022-01-08 NOTE — Progress Notes (Signed)
° ° °  SUBJECTIVE:   CHIEF COMPLAINT / HPI:   Gyn exam: Hx of abnormal PAP. Here for f/u. No GU symptoms.  Itchy thumb rash: C/O itchy rash on and off on her left thumb > six months duration. She uses Neosporin and OTC steroid cream with no improvement. The rash is dry and itchy.  Low back pain:  C/O Low back pain, unable to completely stand after prolong sitting  - 8/10. Currently asymptomatic. Pain occurs 2-3 times a day. No recent fall.  Contraceptive management-  She is currently not on anything for birth control. Her friend is on Lo estrin which worked well for her, hence she wants to try that. She uses Condoms regularly.  PERTINENT  PMH / PSH: PMHx reviewed  OBJECTIVE:   BP 110/80    Ht 5\' 3"  (1.6 m)    Wt 140 lb 4 oz (63.6 kg)    LMP 12/17/2021    BMI 24.84 kg/m   Physical Exam Vitals and nursing note reviewed.  Cardiovascular:     Rate and Rhythm: Normal rate and regular rhythm.     Heart sounds: Normal heart sounds. No murmur heard. Pulmonary:     Effort: Pulmonary effort is normal. No respiratory distress.     Breath sounds: Normal breath sounds. No wheezing.  Abdominal:     General: Abdomen is flat. Bowel sounds are normal. There is no distension.     Palpations: Abdomen is soft. There is no mass.     Tenderness: There is no abdominal tenderness.  Musculoskeletal:     Right lower leg: No edema.     Left lower leg: No edema.  Skin:    Comments: Scaly, hypopigmented patch in the dorsum of her left thumb      ASSESSMENT/PLAN:  Gyn exam: Normal exam. PAP completed. I will contact her soon with results.  Dermatitis: Likely allergic vs contact vs atopic dermatitis Trial of triamcinolone discussed. Biopsy if no improvement. She agreed with the plan.  Lumbar pain ?? Overuse Xray ordered. Tylenol as needed for now.   Contraceptive management: Neg upreg Lo Loestrin Fe prescribed.   Andrena Mews, MD [place]

## 2022-01-08 NOTE — Telephone Encounter (Signed)
Neg Upreg result discussed. May start OCP. She verbalized understanding.

## 2022-01-08 NOTE — Assessment & Plan Note (Signed)
??   Overuse Xray ordered. Tylenol as needed for now.

## 2022-01-08 NOTE — Patient Instructions (Signed)
Pap Test °Why am I having this test? °A Pap test, also called a Pap smear, is a screening test to check for signs of: °Infection. °Cancer of the cervix. The cervix is the lower part of the uterus that opens into the vagina. °Changes that may be a sign that cancer is developing (precancerous changes). °Women need this test on a regular basis. In general, you should have a Pap test every 3 years until you reach menopause or age 39. Women aged 30-60 may choose to have their Pap test done at the same time as an HPV (human papillomavirus) test every 5 years (instead of every 3 years). °Your health care provider may recommend having Pap tests more or less often depending on your medical conditions and past Pap test results. °What is being tested? °Cervical cells are tested for signs of infection or abnormalities. °What kind of sample is taken? °Your health care provider will collect a sample of cells from the surface of your cervix. This will be done using a small cotton swab, plastic spatula, or brush that is inserted into your vagina using a tool called a speculum. This sample is often collected during a pelvic exam, when you are lying on your back on an exam table with your feet in footrests (stirrups). In some cases, fluids (secretions) from the cervix or vagina may also be collected. °How do I prepare for this test? °Be aware of where you are in your menstrual cycle. If you are menstruating on the day of the test, you may be asked to reschedule. °You may need to reschedule if you have a known vaginal infection on the day of the test. °Follow instructions from your health care provider about: °Changing or stopping your regular medicines. Some medicines can cause abnormal test results, such as vaginal medicines and tetracycline. °Avoiding douching 2-3 days before or the day of the test. °Tell a health care provider about: °Any allergies you have. °All medicines you are taking, including vitamins, herbs, eye drops,  creams, and over-the-counter medicines. °Any bleeding problems you have. °Any surgeries you have had. °Any medical conditions you have. °Whether you are pregnant or may be pregnant. °How are the results reported? °Your test results will be reported as either abnormal or normal. °What do the results mean? °A normal test result means that you do not have signs of cancer of the cervix. °An abnormal result may mean that you have: °Cancer. A Pap test by itself is not enough to diagnose cancer. You will have more tests done if cancer is suspected. °Precancerous changes in your cervix. °Inflammation of the cervix. °An STI (sexually transmitted infection). °A fungal infection. °A parasite infection. °Talk with your health care provider about what your results mean. In some cases, your health care provider may do more testing to confirm the results. °Questions to ask your health care provider °Ask your health care provider, or the department that is doing the test: °When will my results be ready? °How will I get my results? °What are my treatment options? °What other tests do I need? °What are my next steps? °Summary °In general, women should have a Pap test every 3 years until they reach menopause or age 39. °Your health care provider will collect a sample of cells from the surface of your cervix. This will be done using a small cotton swab, plastic spatula, or brush. °In some cases, fluids (secretions) from the cervix or vagina may also be collected. °This information is not intended   to replace advice given to you by your health care provider. Make sure you discuss any questions you have with your health care provider. °Document Revised: 03/15/2021 Document Reviewed: 03/15/2021 °Elsevier Patient Education © 2022 Else[employer] Inc. ° °

## 2022-01-09 NOTE — Progress Notes (Signed)
I called her pharm. No issue with her prescription. I have advised her to contact her pharmacy to pick up meds. Patient verbalized understanding.

## 2022-01-13 ENCOUNTER — Telehealth: Payer: Self-pay | Admitting: Family Medicine

## 2022-01-13 LAB — CYTOLOGY - PAP
Comment: NEGATIVE
Comment: NEGATIVE
HPV 16: NEGATIVE
HPV 18 / 45: NEGATIVE
High risk HPV: POSITIVE — AB

## 2022-01-13 NOTE — Telephone Encounter (Signed)
HIPAA compliant callback message left.   Please advise patient when she calls that her PAP test is abnormal and she need a colposcopy appointment.  Please, help her schedule colpo clinic appointment as well. Thank you.

## 2022-01-30 ENCOUNTER — Encounter: Payer: Self-pay | Admitting: Family Medicine

## 2022-02-13 ENCOUNTER — Ambulatory Visit (INDEPENDENT_AMBULATORY_CARE_PROVIDER_SITE_OTHER): Payer: Medicaid Other | Admitting: Family Medicine

## 2022-02-13 ENCOUNTER — Other Ambulatory Visit: Payer: Self-pay

## 2022-02-13 ENCOUNTER — Other Ambulatory Visit: Payer: Self-pay | Admitting: Family Medicine

## 2022-02-13 VITALS — BP 133/105 | HR 83 | Wt 141.8 lb

## 2022-02-13 DIAGNOSIS — Z9889 Other specified postprocedural states: Secondary | ICD-10-CM | POA: Diagnosis not present

## 2022-02-13 DIAGNOSIS — R87612 Low grade squamous intraepithelial lesion on cytologic smear of cervix (LGSIL): Secondary | ICD-10-CM | POA: Insufficient documentation

## 2022-02-13 DIAGNOSIS — B977 Papillomavirus as the cause of diseases classified elsewhere: Secondary | ICD-10-CM | POA: Diagnosis not present

## 2022-02-13 DIAGNOSIS — R87613 High grade squamous intraepithelial lesion on cytologic smear of cervix (HGSIL): Secondary | ICD-10-CM

## 2022-02-13 DIAGNOSIS — N72 Inflammatory disease of cervix uteri: Secondary | ICD-10-CM

## 2022-02-13 LAB — POCT URINE PREGNANCY: Preg Test, Ur: NEGATIVE

## 2022-02-13 MED ORDER — IBUPROFEN 200 MG PO TABS
600.0000 mg | ORAL_TABLET | Freq: Once | ORAL | Status: AC
  Administered 2022-02-13: 600 mg via ORAL

## 2022-02-13 NOTE — Progress Notes (Signed)
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 Gwendlyn Deutscher and Dr. Thelma Barge) Patient was referred to gynecology but did not follow up - Pap 2015 NILM  Patient given informed consent, signed copy in the chart.  Placed in lithotomy position. Cervix viewed with speculum and colposcope after application of acetic acid.   Colposcopy adequate (entire squamocolumnar junctions seen  in entirety) ?  Yes. Visible at 12:00 to 2:00 area was red, eroded appearing and friable. Visible at 5:00 - 6:00 area some abnomral vessels. Acetowhite lesions?no true acetowhite Punctation?no  Mosaicism?  Yes at 6:00 Abnormal vasculature?  Yes at 6:00  Biopsies?yes, one at 12:00 and one at 6:00. Some bleeding with biopsies and monsel's solution used for hemostasis ECC?no as neither area of abnormality extended into the endocervical canal. Complications? None EBL < 5 cc  COMMENTS: Patient was given post procedure instructions.  I will notify her of any pathology results. Confirmed phone number and Ok to leave HIPPA compliant phone message [phone] (M)

## 2022-02-13 NOTE — Patient Instructions (Signed)
The colposcopy we did today showed some additional abnormalities on the cervix so we did to biopsies.  I should have the pathology results of those early next week and I will call you.  Since we did biopsies, I would recommend nothing in the vagina for the next 24 hours, specifically no sexual intercourse, no tampons, no douching.  You will likely have a dark brown/bloody discharge for the next 24 hours.  It should stop after that.  You may have a little bit of mild cramping and I would use what ever you typically use for menstrual cramps such as Tylenol or over-the-counter ibuprofen.  You should not have intense pain or fever or heavy bleeding.  If you have any of those, please be seen at an urgent care facility.

## 2022-02-13 NOTE — Assessment & Plan Note (Signed)
Colposcopy was clinically ABNORMAL and two biosies were taken. Await pathology results, but appearance abnormal enough that if pathology is negative, I would repeat colposcopy in 4-6 weeks and or refer back to GYN.

## 2022-02-21 ENCOUNTER — Telehealth: Payer: Self-pay | Admitting: Family Medicine

## 2022-02-21 ENCOUNTER — Encounter: Payer: Self-pay | Admitting: Family Medicine

## 2022-02-21 NOTE — Progress Notes (Signed)
Discussed with her by phone.  Recommend repeat colposcopy in August 2023.  She is amenable.

## 2022-02-21 NOTE — Progress Notes (Signed)
I spoke with patient regarding her pathology results.  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  I reviewed her history of abnormalities and I think we will repeat a colposcopy in 6 months.  I discussed this with her on the phone.  I will send her a letter in the mail and she will call and set up an appointment with our women's health clinic in 6 months.  I answered all her questions.

## 2022-02-21 NOTE — Telephone Encounter (Signed)
I spoke with patient regarding her pathology results.  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  I reviewed her history of abnormalities and I think we will repeat a colposcopy in 6 months.  I discussed this with her on the phone.  I will send her a letter in the mail and she will call and set up an appointment with our women's health clinic in 6 months.  I answered all her questions.

## 2022-04-07 ENCOUNTER — Encounter: Payer: Self-pay | Admitting: Family Medicine

## 2022-04-07 DIAGNOSIS — Z9189 Other specified personal risk factors, not elsewhere classified: Secondary | ICD-10-CM | POA: Insufficient documentation

## 2022-04-08 ENCOUNTER — Ambulatory Visit: Payer: Medicaid Other | Admitting: Family Medicine

## 2022-06-03 ENCOUNTER — Encounter: Payer: Self-pay | Admitting: *Deleted

## 2022-12-04 ENCOUNTER — Encounter: Payer: Self-pay | Admitting: Family Medicine

## 2022-12-04 ENCOUNTER — Ambulatory Visit (INDEPENDENT_AMBULATORY_CARE_PROVIDER_SITE_OTHER): Payer: Medicaid Other | Admitting: Family Medicine

## 2022-12-04 VITALS — BP 149/102 | HR 114 | Ht 63.0 in | Wt 147.4 lb

## 2022-12-04 DIAGNOSIS — J069 Acute upper respiratory infection, unspecified: Secondary | ICD-10-CM

## 2022-12-04 DIAGNOSIS — R030 Elevated blood-pressure reading, without diagnosis of hypertension: Secondary | ICD-10-CM

## 2022-12-04 DIAGNOSIS — R635 Abnormal weight gain: Secondary | ICD-10-CM

## 2022-12-04 MED ORDER — BENZONATATE 100 MG PO CAPS: 100.0000 mg | ORAL_CAPSULE | Freq: Two times a day (BID) | ORAL | 0 refills | Status: DC | PRN

## 2022-12-04 NOTE — Patient Instructions (Signed)
At this point, I think you have a viral illness.  I do think that your allergies are probably making some of your symptoms worse but I do think you are sick with something.  I would recommend using Tylenol and ibuprofen as needed, try to get a thermometer and see if you are spiking any fevers at home.  I sent in a prescription for Tessalon Perles that you can take as prescribed.  Please make sure to lock these up as they can be very dangerous if kids get a hold of them.  At this point, I would recommend continuing taking the over-the-counter medications as needed.  I would recommend adding in a nasal saline spray to see if that helps anything.  If you are feeling really congested you can also try Flonase spray (just make sure that when you angle the sprayer it is torture ears and not toward your septum, make sure to look up videos on how to administer this right).

## 2022-12-04 NOTE — Progress Notes (Signed)
    SUBJECTIVE:   CHIEF COMPLAINT / HPI:   URI symptoms - started 3 days ago - No one in the house is sick other than the patient does have children to go to school - cough, fatigue, runny nose, congestion, sore throat - Has taken a lot of OTC medications without any improvement - Stuffy and runny nose is the worst of the symptoms right now  - Itching and runny eyes   PERTINENT  PMH / PSH: Allergies  OBJECTIVE:   BP (!) 149/102   Pulse (!) 114   Ht 5\' 3"  (1.6 m)   Wt 147 lb 6.4 oz (66.9 kg)   LMP 12/04/2022   SpO2 100%   BMI 26.11 kg/m   Gen: Appears ill but nontoxic, NAD, diaphoresis present on face HEENT: Sinuses nontender to palpation, mild oropharyngeal erythema, no tonsillar exudates, no cervical lymphadenopathy CV: RRR, no m/r/g appreciated, no peripheral edema Pulm: CTAB, no wheezes/crackles GI: soft, non-tender, non-distended  ASSESSMENT/PLAN:   Viral URI with cough Symptoms seem more consistent with viral URI but probably complicated by worsening allergy symptoms.  Recommend patient continue OTC treatment and we will work with symptom management.  Out of the window for flu treatment at this time and is low risk for COVID, given that the test is also send out we would not have the results back before the patient could be treated anyway so do not feel that testing is warranted. - Recommend addition of nasal saline spray (versus Flonase based on symptoms) - Continue OTC treatment - Tessalon Perles 100 mg twice daily prescribed - Patient instructed to lock Tessalon Perles up given she has children  Blood pressure Patient is asymptomatic in regards to the elevated blood pressure, it is likely related to the medications and patient not feeling well.  Patient was tachycardic and sweating in the clinic, feel that she may have been febrile at that time. - Make sure to follow-up in clinic - Follow-up in 1 to 2 weeks  Weight gain Unable to address this visit, patient is  concerned about her persistent weight gain over the last 2 years. - Recommend patient follow-up in the next 1 to 2 weeks after symptoms resolve   14/06/2022, DO [place]

## 2023-05-05 ENCOUNTER — Encounter: Payer: Self-pay | Admitting: Family Medicine

## 2023-05-05 ENCOUNTER — Ambulatory Visit (INDEPENDENT_AMBULATORY_CARE_PROVIDER_SITE_OTHER): Payer: Medicaid Other | Admitting: Family Medicine

## 2023-05-05 ENCOUNTER — Other Ambulatory Visit (HOSPITAL_COMMUNITY)
Admission: RE | Admit: 2023-05-05 | Discharge: 2023-05-05 | Disposition: A | Discharge status: Home / Self Care | Payer: Medicaid Other | Source: Ambulatory Visit | Attending: Family Medicine | Admitting: Family Medicine

## 2023-05-05 VITALS — BP 131/97 | HR 90 | Ht 63.0 in | Wt 148.0 lb

## 2023-05-05 DIAGNOSIS — R635 Abnormal weight gain: Secondary | ICD-10-CM

## 2023-05-05 DIAGNOSIS — Z124 Encounter for screening for malignant neoplasm of cervix: Secondary | ICD-10-CM | POA: Insufficient documentation

## 2023-05-05 DIAGNOSIS — E663 Overweight: Secondary | ICD-10-CM

## 2023-05-05 DIAGNOSIS — R5383 Other fatigue: Secondary | ICD-10-CM

## 2023-05-05 DIAGNOSIS — R030 Elevated blood-pressure reading, without diagnosis of hypertension: Secondary | ICD-10-CM

## 2023-05-05 NOTE — Assessment & Plan Note (Signed)
Etiology unclear. May be stress related. TSH to r/o endocrine disorder. CBC to assess for anemia. I will contact her soon with her test results.

## 2023-05-05 NOTE — Patient Instructions (Addendum)
Calorie Counting for Weight Loss Calories are units of energy. Your body needs a certain number of calories from food to keep going throughout the day. When you eat or drink more calories than your body needs, your body stores the extra calories mostly as fat. When you eat or drink fewer calories than your body needs, your body burns fat to get the energy it needs. Calorie counting means keeping track of how many calories you eat and drink each day. Calorie counting can be helpful if you need to lose weight. If you eat fewer calories than your body needs, you should lose weight. Ask your health care provider what a healthy weight is for you. For calorie counting to work, you will need to eat the right number of calories each day to lose a healthy amount of weight per week. A dietitian can help you figure out how many calories you need in a day and will suggest ways to reach your calorie goal. A healthy amount of weight to lose each week is usually 1-2 lb (0.5-0.9 kg). This usually means that your daily calorie intake should be reduced by 500-750 calories. Eating 1,200-1,500 calories a day can help most women lose weight. Eating 1,500-1,800 calories a day can help most men lose weight. What do I need to know about calorie counting? Work with your health care provider or dietitian to determine how many calories you should get each day. To meet your daily calorie goal, you will need to: Find out how many calories are in each food that you would like to eat. Try to do this before you eat. Decide how much of the food you plan to eat. Keep a food log. Do this by writing down what you ate and how many calories it had. To successfully lose weight, it is important to balance calorie counting with a healthy lifestyle that includes regular activity. Where do I find calorie information?  The number of calories in a food can be found on a Nutrition Facts label. If a food does not have a Nutrition Facts label, try  to look up the calories online or ask your dietitian for help. Remember that calories are listed per serving. If you choose to have more than one serving of a food, you will have to multiply the calories per serving by the number of servings you plan to eat. For example, the label on a package of bread might say that a serving size is 1 slice and that there are 90 calories in a serving. If you eat 1 slice, you will have eaten 90 calories. If you eat 2 slices, you will have eaten 180 calories. How do I keep a food log? After each time that you eat, record the following in your food log as soon as possible: What you ate. Be sure to include toppings, sauces, and other extras on the food. How much you ate. This can be measured in cups, ounces, or number of items. How many calories were in each food and drink. The total number of calories in the food you ate. Keep your food log near you, such as in a pocket-sized notebook or on an app or website on your mobile phone. Some programs will calculate calories for you and show you how many calories you have left to meet your daily goal. What are some portion-control tips? Know how many calories are in a serving. This will help you know how many servings you can have of a certain   food. Use a measuring cup to measure serving sizes. You could also try weighing out portions on a kitchen scale. With time, you will be able to estimate serving sizes for some foods. Take time to put servings of different foods on your favorite plates or in your favorite bowls and cups so you know what a serving looks like. Try not to eat straight from a food's packaging, such as from a bag or box. Eating straight from the package makes it hard to see how much you are eating and can lead to overeating. Put the amount you would like to eat in a cup or on a plate to make sure you are eating the right portion. Use smaller plates, glasses, and bowls for smaller portions and to prevent  overeating. Try not to multitask. For example, avoid watching TV or using your computer while eating. If it is time to eat, sit down at a table and enjoy your food. This will help you recognize when you are full. It will also help you be more mindful of what and how much you are eating. What are tips for following this plan? Reading food labels Check the calorie count compared with the serving size. The serving size may be smaller than what you are used to eating. Check the source of the calories. Try to choose foods that are high in protein, fiber, and vitamins, and low in saturated fat, trans fat, and sodium. Shopping Read nutrition labels while you shop. This will help you make healthy decisions about which foods to buy. Pay attention to nutrition labels for low-fat or fat-free foods. These foods sometimes have the same number of calories or more calories than the full-fat versions. They also often have added sugar, starch, or salt to make up for flavor that was removed with the fat. Make a grocery list of lower-calorie foods and stick to it. Cooking Try to cook your favorite foods in a healthier way. For example, try baking instead of frying. Use low-fat dairy products. Meal planning Use more fruits and vegetables. One-half of your plate should be fruits and vegetables. Include lean proteins, such as chicken, turkey, and fish. Lifestyle Each week, aim to do one of the following: 150 minutes of moderate exercise, such as walking. 75 minutes of vigorous exercise, such as running. General information Know how many calories are in the foods you eat most often. This will help you calculate calorie counts faster. Find a way of tracking calories that works for you. Get creative. Try different apps or programs if writing down calories does not work for you. What foods should I eat?  Eat nutritious foods. It is better to have a nutritious, high-calorie food, such as an avocado, than a food with  few nutrients, such as a bag of potato chips. Use your calories on foods and drinks that will fill you up and will not leave you hungry soon after eating. Examples of foods that fill you up are nuts and nut butters, vegetables, lean proteins, and high-fiber foods such as whole grains. High-fiber foods are foods with more than 5 g of fiber per serving. Pay attention to calories in drinks. Low-calorie drinks include water and unsweetened drinks. The items listed above may not be a complete list of foods and beverages you can eat. Contact a dietitian for more information. What foods should I limit? Limit foods or drinks that are not good sources of vitamins, minerals, or protein or that are high in unhealthy fats. These   include: Candy. Other sweets. Sodas, specialty coffee drinks, alcohol, and juice. The items listed above may not be a complete list of foods and beverages you should avoid. Contact a dietitian for more information. How do I count calories when eating out? Pay attention to portions. Often, portions are much larger when eating out. Try these tips to keep portions smaller: Consider sharing a meal instead of getting your own. If you get your own meal, eat only half of it. Before you start eating, ask for a container and put half of your meal into it. When available, consider ordering smaller portions from the menu instead of full portions. Pay attention to your food and drink choices. Knowing the way food is cooked and what is included with the meal can help you eat fewer calories. If calories are listed on the menu, choose the lower-calorie options. Choose dishes that include vegetables, fruits, whole grains, low-fat dairy products, and lean proteins. Choose items that are boiled, broiled, grilled, or steamed. Avoid items that are buttered, battered, fried, or served with cream sauce. Items labeled as crispy are usually fried, unless stated otherwise. Choose water, low-fat milk,  unsweetened iced tea, or other drinks without added sugar. If you want an alcoholic beverage, choose a lower-calorie option, such as a glass of wine or light beer. Ask for dressings, sauces, and syrups on the side. These are usually high in calories, so you should limit the amount you eat. If you want a salad, choose a garden salad and ask for grilled meats. Avoid extra toppings such as bacon, cheese, or fried items. Ask for the dressing on the side, or ask for olive oil and vinegar or lemon to use as dressing. Estimate how many servings of a food you are given. Knowing serving sizes will help you be aware of how much food you are eating at restaurants. Where to find more information Centers for Disease Control and Prevention: www.cdc.gov [place] Department of Agriculture: myplate.gov Summary Calorie counting means keeping track of how many calories you eat and drink each day. If you eat fewer calories than your body needs, you should lose weight. A healthy amount of weight to lose per week is usually 1-2 lb (0.5-0.9 kg). This usually means reducing your daily calorie intake by 500-750 calories. The number of calories in a food can be found on a Nutrition Facts label. If a food does not have a Nutrition Facts label, try to look up the calories online or ask your dietitian for help. Use smaller plates, glasses, and bowls for smaller portions and to prevent overeating. Use your calories on foods and drinks that will fill you up and not leave you hungry shortly after a meal. This information is not intended to replace advice given to you by your health care provider. Make sure you discuss any questions you have with your health care provider. Document Revised: 01/26/2020 Document Reviewed: 01/26/2020 Elsevier Patient Education  2023 [employer]  

## 2023-05-05 NOTE — Assessment & Plan Note (Signed)
Diet and exercise counseling done. Check TSH. Since her BMI is less than 30, we would avoid pharmacotherapy for weight loss for now. She declined referral to dietitian. Monitor closely with lifestyle modification.

## 2023-05-05 NOTE — Progress Notes (Signed)
    SUBJECTIVE:   CHIEF COMPLAINT / HPI:   Weight management: Here for weight management. She has been unable to lose weight for the last few months despite minimal carb intake. She eats healthy for the most part. Her diet consists of fruits, Veg, and rice daily. She drinks soda occasionally but mostly water. She walks at least twice a week for about 30 - 40 minutes. 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  Fatigue:  C/O tiredness x 3 months. This occurs on a daily basis. She denies change in bowel habits and denies excessive menstrual bleeding. Her LMP 04/19/23.  Elevated BP:  No neurologic or cardiopulm symptoms.  GYN exam: Hx of abnormal PAP. Here for f/u.    PERTINENT  PMH / PSH: PMHx reviewed  OBJECTIVE:   BP (!) 131/97   Pulse 90   Ht 5\' 3"  (1.6 m)   Wt 148 lb (67.1 kg)   LMP  (LMP Unknown)   BMI 26.22 kg/m   Physical Exam Vitals and nursing note reviewed. Exam conducted with a chaperone present Clabe Seal Legette).  Cardiovascular:     Rate and Rhythm: Normal rate and regular rhythm.     Heart sounds: Normal heart sounds. No murmur heard. Pulmonary:     Effort: Pulmonary effort is normal. No respiratory distress.     Breath sounds: Normal breath sounds.  Abdominal:     General: Abdomen is flat. There is no distension.     Palpations: Abdomen is soft. There is no mass.     Tenderness: There is no abdominal tenderness.  Genitourinary:    Exam position: Lithotomy position.     Labia:        Right: No lesion.        Left: No lesion.      Vagina: Normal.     Cervix: Normal.     Uterus: Normal.      Adnexa: Right adnexa normal.  Musculoskeletal:     Right lower leg: No edema.     Left lower leg: No edema.      ASSESSMENT/PLAN:   Overweight Diet and exercise counseling done. Check TSH. Since her BMI is less than 30, we would avoid pharmacotherapy for weight loss for now. She declined referral to dietitian. Monitor  closely with lifestyle modification.   Fatigue Etiology unclear. May be stress related. TSH to r/o endocrine disorder. CBC to assess for anemia. I will contact her soon with her test results.   Elevated blood pressure reading Has previous hx of elevated BP readings. She is asymptomatic. Bmet checked today. Lifestyle modification discussed. F/U in 4 weeks for reassessment. She agreed with the plan.    Gyn exam: PAP completed today. I will contact her soon with her test result.  Does not want to receive COVID-19 vaccine in the future.  Janit Pagan, MD [facility]

## 2023-05-05 NOTE — Assessment & Plan Note (Signed)
Has previous hx of elevated BP readings. She is asymptomatic. Bmet checked today. Lifestyle modification discussed. F/U in 4 weeks for reassessment. She agreed with the plan.

## 2023-05-06 ENCOUNTER — Telehealth: Payer: Self-pay | Admitting: Family Medicine

## 2023-05-06 LAB — BASIC METABOLIC PANEL
BUN/Creatinine Ratio: 13 (ref 9–23)
BUN: 9 mg/dL (ref 6–20)
CO2: 21 mmol/L (ref 20–29)
Calcium: 9.2 mg/dL (ref 8.7–10.2)
Chloride: 102 mmol/L (ref 96–106)
Creatinine, Ser: 0.68 mg/dL (ref 0.57–1.00)
Glucose: 79 mg/dL (ref 70–99)
Potassium: 3.9 mmol/L (ref 3.5–5.2)
Sodium: 138 mmol/L (ref 134–144)
eGFR: 114 mL/min/{1.73_m2} (ref >59)

## 2023-05-06 LAB — CBC
Hematocrit: 42.2 % (ref 34.0–46.6)
Hemoglobin: 12.9 g/dL (ref 11.1–15.9)
MCH: 25.1 pg — ABNORMAL LOW (ref 26.6–33.0)
MCHC: 30.6 g/dL — ABNORMAL LOW (ref 31.5–35.7)
MCV: 82 fL (ref 79–97)
Platelets: 344 10*3/uL (ref 150–450)
RBC: 5.14 x10E6/uL (ref 3.77–5.28)
RDW: 13.8 % (ref 11.7–15.4)
WBC: 5.9 10*3/uL (ref 3.4–10.8)

## 2023-05-06 LAB — TSH RFX ON ABNORMAL TO FREE T4: TSH: 1.22 u[IU]/mL (ref 0.450–4.500)

## 2023-05-06 NOTE — Telephone Encounter (Signed)
Unable to reach the patient or leave a message.  Lab looks good and does not explain the reason for her fatigue.  - No anemia - Electrolytes looks good. - TSH is within the normal range.

## 2023-05-06 NOTE — Telephone Encounter (Signed)
Result discussed. PAP pending.  She mentioned issues with constipation. She tried Miralax and other OTC medicine infrequently. A friend gave her Linzess which was useful.  I recommended using Miralax frquently and follow up soon if there is still no improvement.  We discussed MyChart as a mean to communicate and check test results. She requested password update which I did. She will then change her password to a different on soon as she can access it. She was appreciative of the call.

## 2023-05-08 ENCOUNTER — Telehealth: Payer: Self-pay | Admitting: Family Medicine

## 2023-05-08 LAB — CYTOLOGY - PAP
Comment: NEGATIVE
Diagnosis: UNDETERMINED — AB
High risk HPV: NEGATIVE

## 2023-05-08 NOTE — Telephone Encounter (Signed)
HIPAA compliant callback message left.  Please advise. PAP result improved. However, I still recommend colposcopy appointment. Please help her schedule colposcopy appointment in Gyn clinic. Thanks.

## 2023-05-11 NOTE — Telephone Encounter (Signed)
PAP result discussed. Colpo recommended. I offered to schedule her colpo appointment. However, she declined this. She prefers to simply repeat her PAP in a year.  She will reach out soon if she changes her mind about obtaining colposcopy.

## 2023-07-27 IMAGING — CR DG LUMBAR SPINE COMPLETE 4+V
5 series · 5 of 5 positions shown · non-contrast
Comparison: 09/28/2006

CLINICAL DATA: Chronic back pain

EXAM:
LUMBAR SPINE - COMPLETE 4+ VIEW

[l-spine ap]
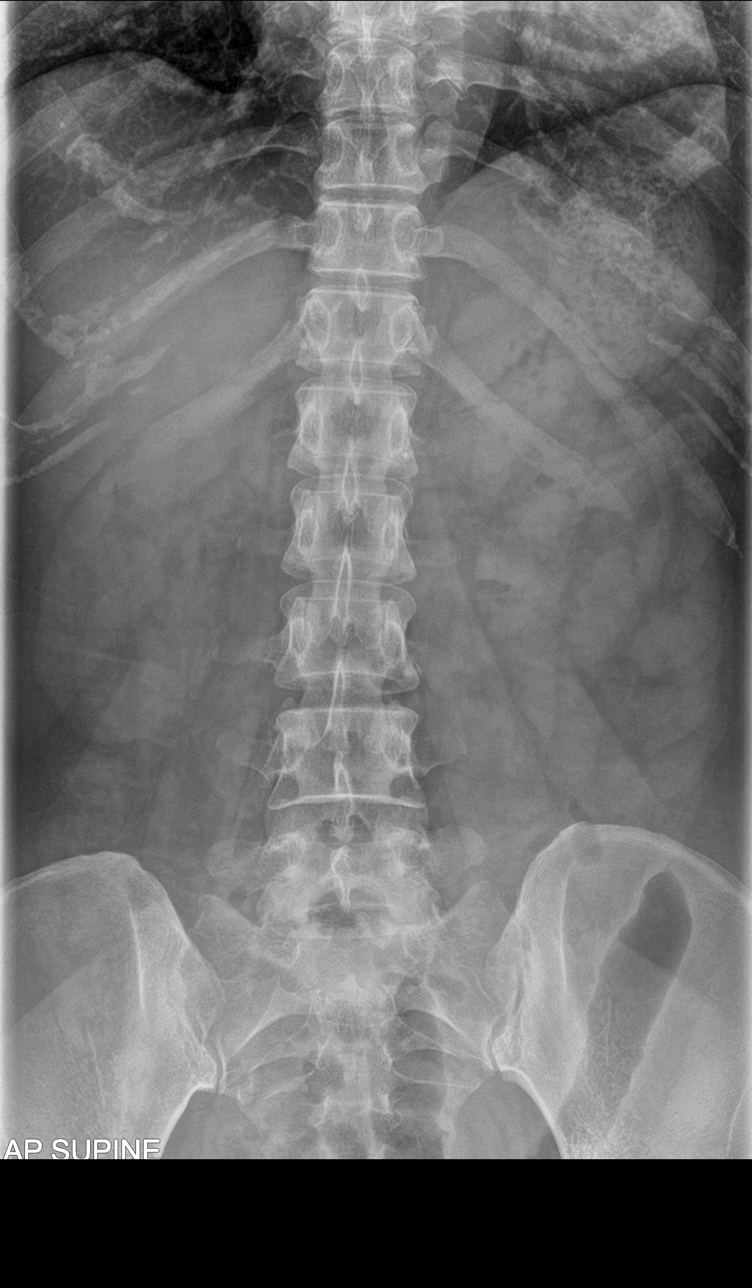

[l-spine obl (1 of 2)]
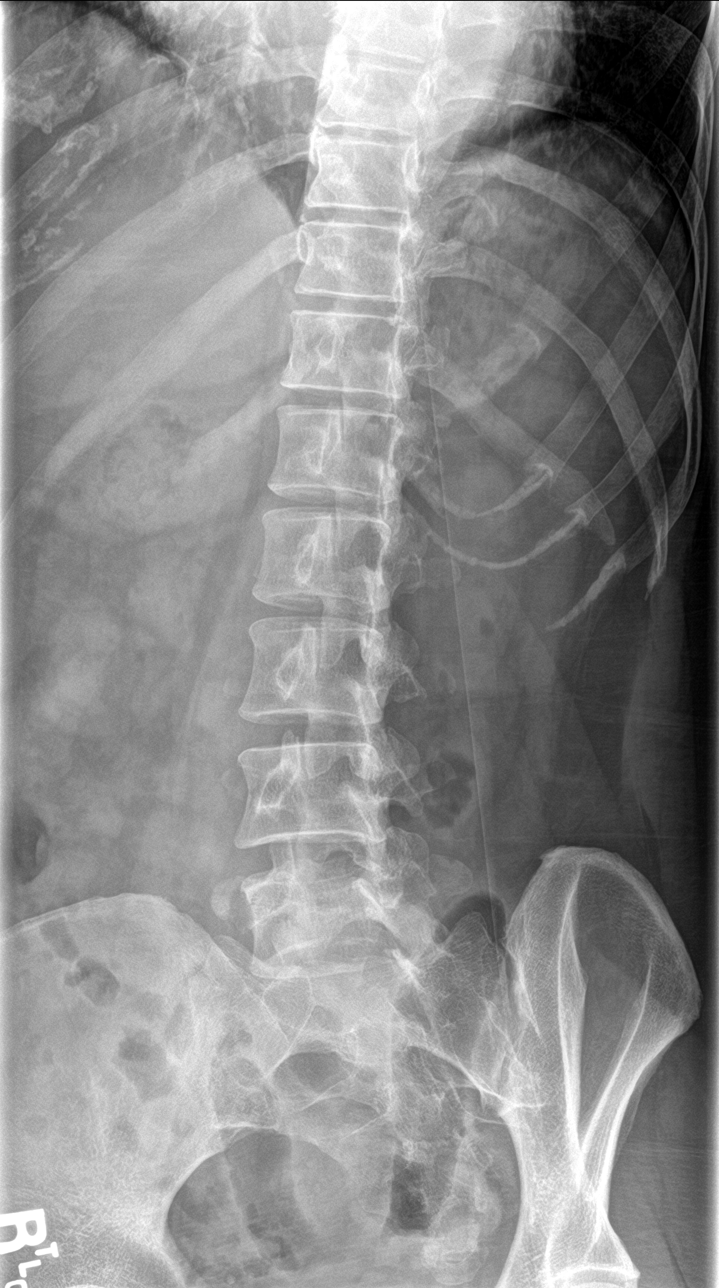

[l-spine obl (2 of 2)]
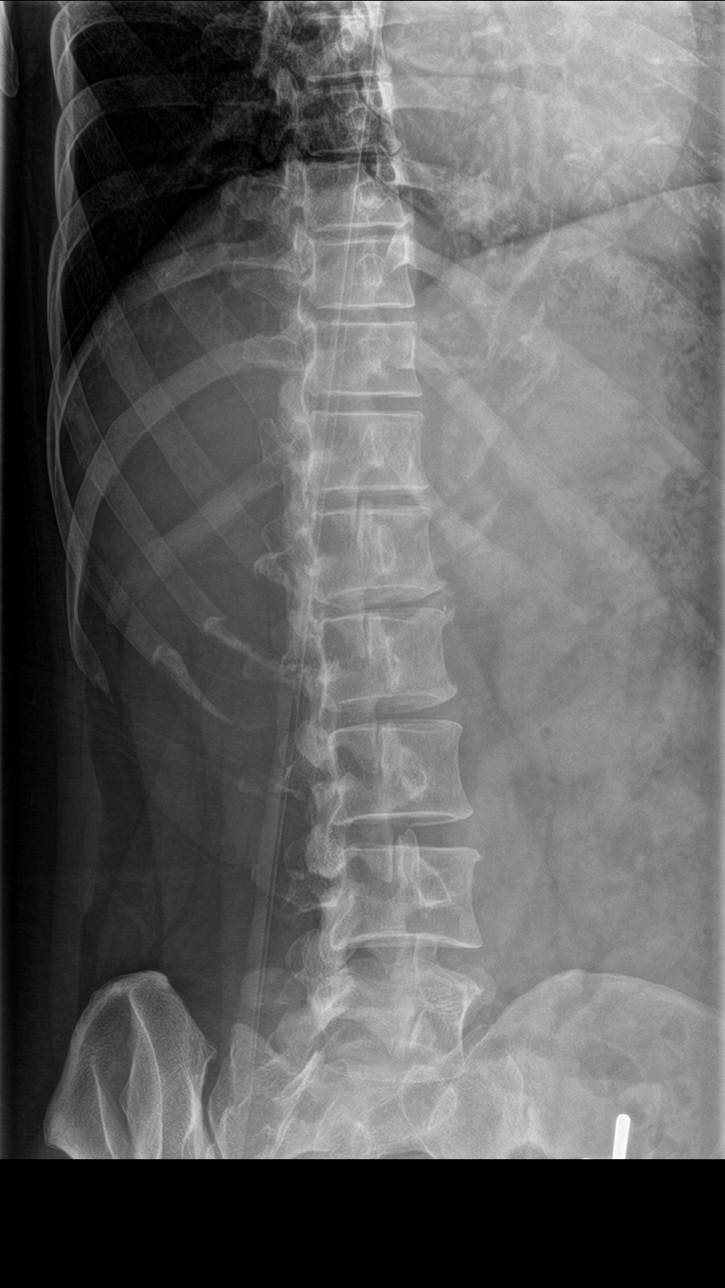

[l-spine lat]
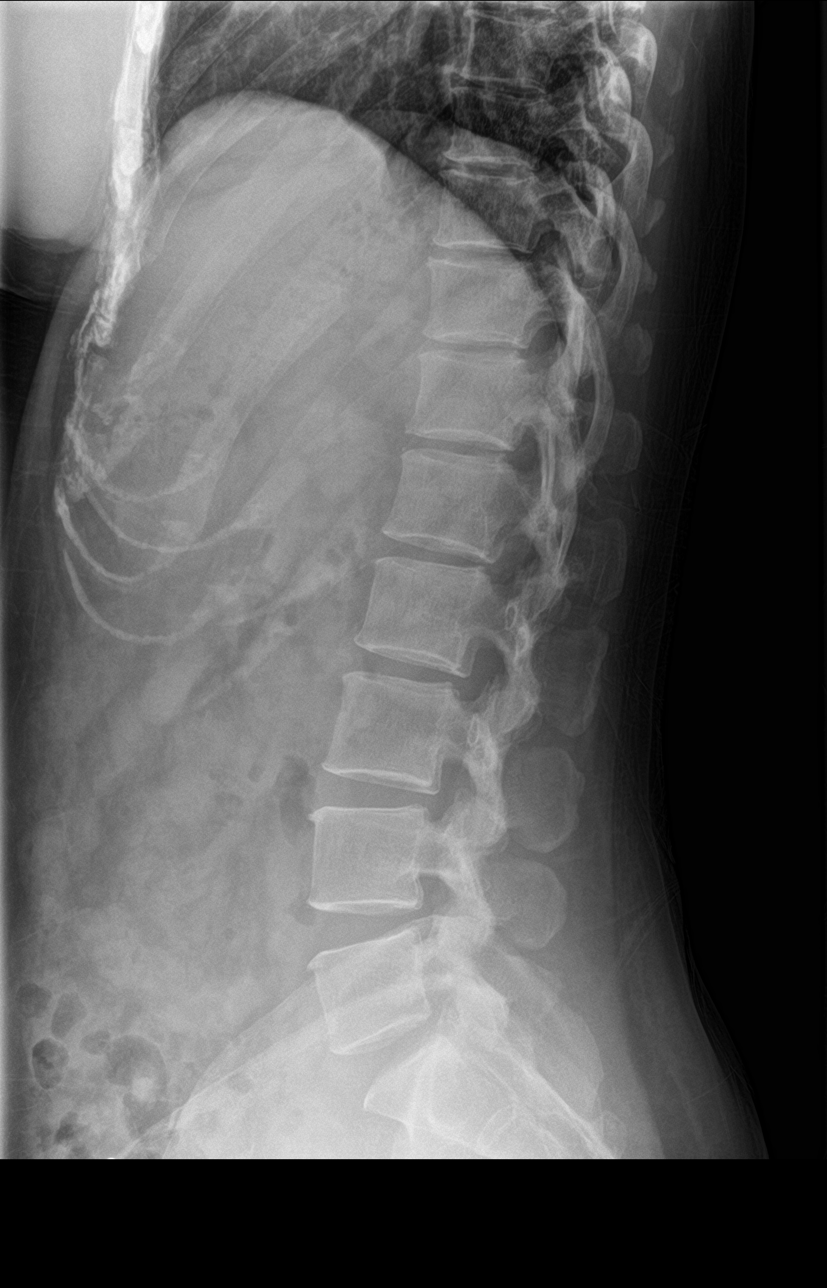

[l-spine spot]
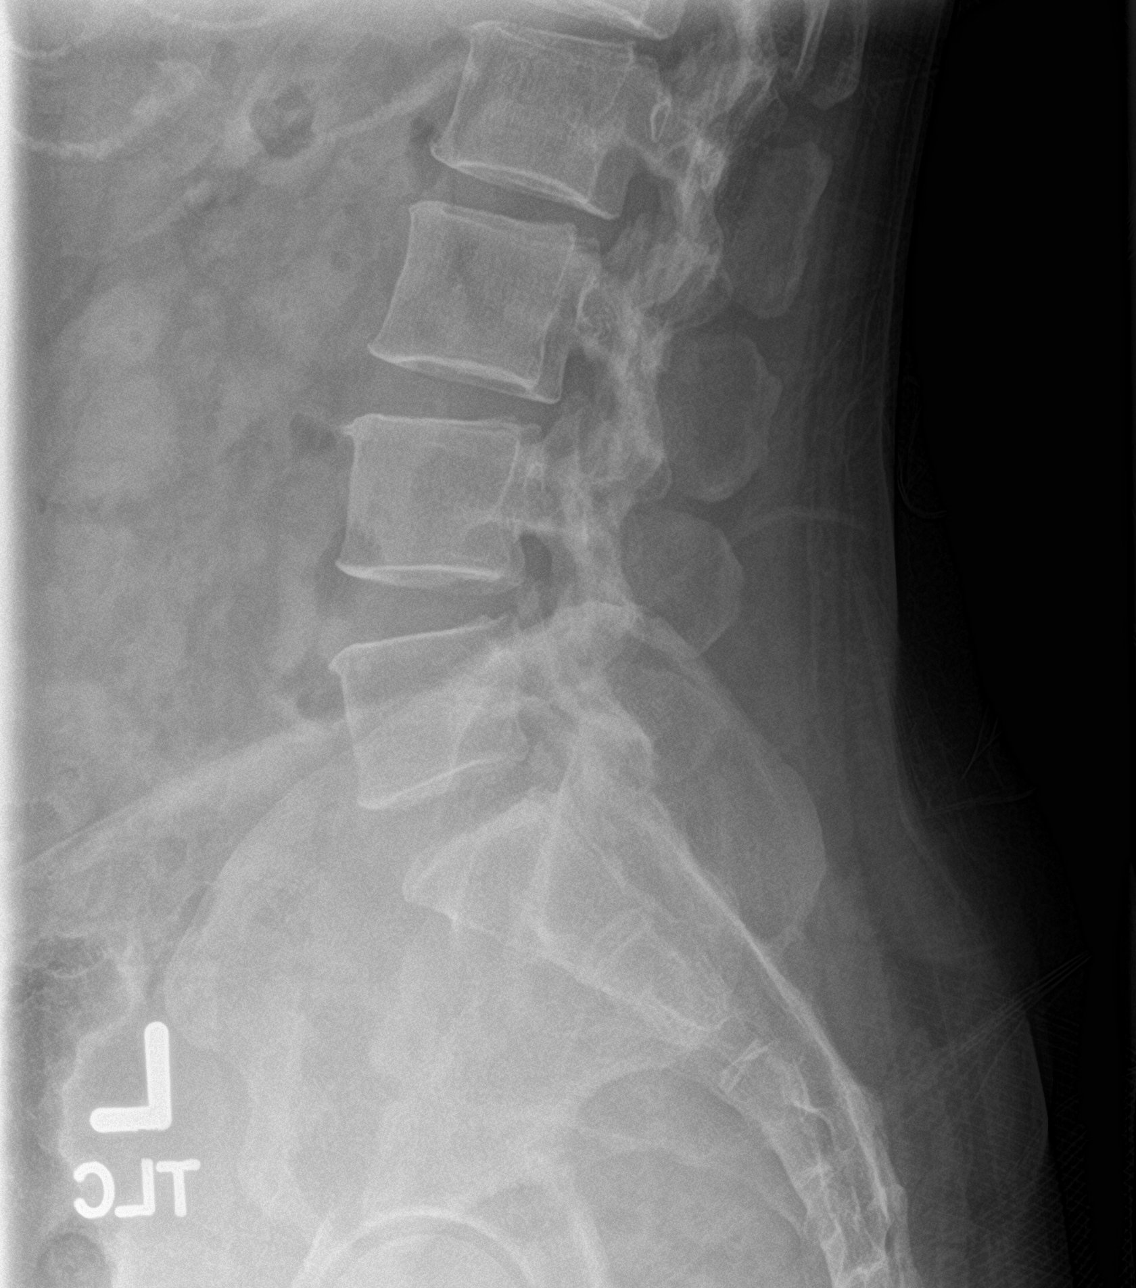

[5 of 5 positions shown; findings below may reference images not displayed]

FINDINGS: There is no evidence of lumbar spine fracture. Alignment is normal.
Intervertebral disc spaces are maintained. No significant facet
joint arthropathy.
IMPRESSION: Negative.

## 2023-09-16 NOTE — Telephone Encounter (Signed)
Routine check up completed. Nurse appointment discussed for COVID-19 and Influenza vaccination. She stated that she does not take both shots and she declined. She is however, still concerned about her weight gain. F/U appointment offered. She opted to call back to schedule as she need to check her calendar.  HM updated.
